# Patient Record
Sex: Male | Born: 1975 | Race: White | Hispanic: No | Marital: Single | Smoking: Current every day smoker
Health system: Southern US, Community
[De-identification: ages and names within clinical notes are randomized; demographics above are authoritative.]

## PROBLEM LIST (undated history)

## (undated) DIAGNOSIS — M6282 Rhabdomyolysis: Secondary | ICD-10-CM

## (undated) DIAGNOSIS — N179 Acute kidney failure, unspecified: Secondary | ICD-10-CM

## (undated) DIAGNOSIS — F988 Other specified behavioral and emotional disorders with onset usually occurring in childhood and adolescence: Secondary | ICD-10-CM

## (undated) HISTORY — PX: KNEE SURGERY: SHX244

---

## 2018-05-22 ENCOUNTER — Other Ambulatory Visit: Payer: Self-pay

## 2018-05-22 ENCOUNTER — Inpatient Hospital Stay (HOSPITAL_COMMUNITY)
Admission: EM | Admit: 2018-05-22 | Discharge: 2018-05-28 | DRG: 683 | Disposition: A | Discharge status: Home / Self Care | Payer: Self-pay | Attending: Internal Medicine | Admitting: Internal Medicine

## 2018-05-22 DIAGNOSIS — R945 Abnormal results of liver function studies: Secondary | ICD-10-CM

## 2018-05-22 DIAGNOSIS — R7303 Prediabetes: Secondary | ICD-10-CM | POA: Diagnosis present

## 2018-05-22 DIAGNOSIS — F17200 Nicotine dependence, unspecified, uncomplicated: Secondary | ICD-10-CM | POA: Diagnosis present

## 2018-05-22 DIAGNOSIS — M549 Dorsalgia, unspecified: Secondary | ICD-10-CM | POA: Diagnosis present

## 2018-05-22 DIAGNOSIS — N179 Acute kidney failure, unspecified: Principal | ICD-10-CM | POA: Diagnosis present

## 2018-05-22 DIAGNOSIS — I10 Essential (primary) hypertension: Secondary | ICD-10-CM | POA: Diagnosis present

## 2018-05-22 DIAGNOSIS — G8929 Other chronic pain: Secondary | ICD-10-CM | POA: Diagnosis present

## 2018-05-22 DIAGNOSIS — B1920 Unspecified viral hepatitis C without hepatic coma: Secondary | ICD-10-CM | POA: Diagnosis present

## 2018-05-22 DIAGNOSIS — R339 Retention of urine, unspecified: Secondary | ICD-10-CM | POA: Diagnosis present

## 2018-05-22 DIAGNOSIS — Z79899 Other long term (current) drug therapy: Secondary | ICD-10-CM

## 2018-05-22 DIAGNOSIS — E860 Dehydration: Secondary | ICD-10-CM | POA: Diagnosis present

## 2018-05-22 DIAGNOSIS — M6282 Rhabdomyolysis: Secondary | ICD-10-CM

## 2018-05-22 DIAGNOSIS — D72829 Elevated white blood cell count, unspecified: Secondary | ICD-10-CM | POA: Diagnosis present

## 2018-05-22 DIAGNOSIS — Z79891 Long term (current) use of opiate analgesic: Secondary | ICD-10-CM

## 2018-05-22 DIAGNOSIS — R7989 Other specified abnormal findings of blood chemistry: Secondary | ICD-10-CM

## 2018-05-22 DIAGNOSIS — K760 Fatty (change of) liver, not elsewhere classified: Secondary | ICD-10-CM | POA: Diagnosis present

## 2018-05-22 HISTORY — DX: Other specified behavioral and emotional disorders with onset usually occurring in childhood and adolescence: F98.8

## 2018-05-22 HISTORY — DX: Rhabdomyolysis: M62.82

## 2018-05-22 HISTORY — DX: Acute kidney failure, unspecified: N17.9

## 2018-05-22 LAB — CBC WITH DIFFERENTIAL/PLATELET
Abs Immature Granulocytes: 0.1 10*3/uL (ref 0.0–0.1)
Basophils Absolute: 0.1 10*3/uL (ref 0.0–0.1)
Basophils Relative: 0 %
EOS ABS: 0 10*3/uL (ref 0.0–0.7)
Eosinophils Relative: 0 %
HEMATOCRIT: 46.8 % (ref 39.0–52.0)
Hemoglobin: 15.7 g/dL (ref 13.0–17.0)
IMMATURE GRANULOCYTES: 1 %
LYMPHS ABS: 3.6 10*3/uL (ref 0.7–4.0)
Lymphocytes Relative: 23 %
MCH: 29 pg (ref 26.0–34.0)
MCHC: 33.5 g/dL (ref 30.0–36.0)
MCV: 86.3 fL (ref 78.0–100.0)
MONOS PCT: 7 %
Monocytes Absolute: 1.1 10*3/uL — ABNORMAL HIGH (ref 0.1–1.0)
NEUTROS ABS: 10.5 10*3/uL — AB (ref 1.7–7.7)
NEUTROS PCT: 69 %
Platelets: 241 10*3/uL (ref 150–400)
RBC: 5.42 MIL/uL (ref 4.22–5.81)
RDW: 12.6 % (ref 11.5–15.5)
WBC: 15.3 10*3/uL — ABNORMAL HIGH (ref 4.0–10.5)

## 2018-05-22 MED ORDER — ONDANSETRON HCL 4 MG/2ML IJ SOLN
4.0000 mg | Freq: Once | INTRAMUSCULAR | Status: AC
  Administered 2018-05-22: 4 mg via INTRAVENOUS
  Filled 2018-05-22: qty 2

## 2018-05-22 MED ORDER — SODIUM CHLORIDE 0.9 % IV BOLUS
1000.0000 mL | Freq: Once | INTRAVENOUS | Status: AC
  Administered 2018-05-22: 1000 mL via INTRAVENOUS

## 2018-05-22 MED ORDER — MORPHINE SULFATE (PF) 4 MG/ML IV SOLN
4.0000 mg | Freq: Once | INTRAVENOUS | Status: AC
  Administered 2018-05-22: 4 mg via INTRAVENOUS
  Filled 2018-05-22: qty 1

## 2018-05-22 NOTE — ED Triage Notes (Signed)
PT arrives via [facility] from [facility], states hasn't been able to void since last night but feels like bladder is full, with painful urniation at last attempt this morning.  Pt has hx of the same which led to prior hospitalization.  Hx HTN, 155/114 BP, sinus tach 113, RR 16, 97% on RA CBG 113 C/o pain in lower and upper back, lower abdomen

## 2018-05-22 NOTE — ED Notes (Signed)
Informed pt of need for urine, encouraged to attempt to urineate

## 2018-05-22 NOTE — ED Provider Notes (Signed)
[facility] EMERGENCY DEPARTMENT Provider Note   CSN: [id number] Arrival date & time: 05/22/18  2022     History   Chief Complaint Chief Complaint  Patient presents with  . Urinary Retention  . Back Pain    HPI Isaac Snyder is a 42 y.o. male.  42 year old male brought in by EMS for complaint of inability to urinate, suprapubic pain and distention, back pain.  Also notes feeling nauseous, sweaty, diffuse body aches.  She states his symptoms started today around noon, states this happened last year and he was [facility] his "kidneys shut down."  Patient states he works outside at [employer], has been sweating a lot for the past few days.  He denies vomiting, fevers, chills, changes in bowel habits.  Patient has had 2 large Mountain Dew drinks as well as 2 large cups of water today, last urinary output was this morning.  Denies any other complaints or concerns.     No past medical history on file.  There are no active problems to display for this patient.    Home Medications    Prior to Admission medications   Medication Sig Start Date End Date Taking? Authorizing Provider  amLODipine (NORVASC) 10 MG tablet Take 10 mg by mouth daily.   Yes [provider]  amphetamine-dextroamphetamine (ADDERALL) 20 MG tablet Take 20 mg by mouth 2 (two) times daily.   Yes [provider]  cloNIDine (CATAPRES) 0.2 MG tablet Take 0.2 mg by mouth 2 (two) times daily.   Yes [provider]  gabapentin (NEURONTIN) 600 MG tablet Take 600 mg by mouth 3 (three) times daily.   Yes [provider]  oxyCODONE (OXY IR/ROXICODONE) 5 MG immediate release tablet Take 5 mg by mouth 4 (four) times daily as needed for severe pain.   Yes [provider]    Family History No family history on file.  Social History Social History   Tobacco Use  . Smoking status: Not on file  Substance Use Topics  . Alcohol use: Not on file  .  Drug use: Not on file     Allergies   Patient has no known allergies.   Review of Systems Review of Systems  Constitutional: Positive for diaphoresis. Negative for chills and fever.  Respiratory: Negative for shortness of breath.   Cardiovascular: Negative for chest pain.  Gastrointestinal: Positive for abdominal distention, abdominal pain and nausea. Negative for constipation, diarrhea and vomiting.  Genitourinary: Positive for difficulty urinating and urgency. Negative for dysuria and frequency.  Musculoskeletal: Positive for back pain and myalgias. Negative for joint swelling.  Skin: Negative for rash and wound.  Allergic/Immunologic: Negative for immunocompromised state.  Neurological: Negative for dizziness and weakness.  Hematological: Does not bruise/bleed easily.  Psychiatric/Behavioral: Negative for confusion.  All other systems reviewed and are negative.    Physical Exam Updated Vital Signs BP (!) 152/128   Pulse (!) 109   Temp 99.7 F (37.6 C) (Oral)   Resp 17   Ht 6' (1.829 m)   Wt 81.6 kg (180 lb)   SpO2 99%   BMI 24.41 kg/m   Physical Exam  Constitutional: He is oriented to person, place, and time. He appears well-developed and well-nourished. No distress.  HENT:  Head: Normocephalic and atraumatic.  Eyes: Conjunctivae are normal.  Neck: Neck supple.  Cardiovascular: Normal heart sounds and intact distal pulses. Tachycardia present.  Pulmonary/Chest: Effort normal and breath sounds normal. No respiratory distress.  Abdominal: Soft. Bowel sounds are normal. He exhibits no distension. There is tenderness in the suprapubic area. There is no guarding and no CVA tenderness.  Musculoskeletal: He exhibits no tenderness.       Thoracic back: Normal.       Lumbar back: Normal.  Neurological: He is alert and oriented to person, place, and time.  Skin: Skin is warm and dry. Capillary refill takes less than 2 seconds. No rash noted. He is not diaphoretic.    Psychiatric: He has a normal mood and affect. His behavior is normal.  Nursing note and vitals reviewed.    ED Treatments / Results  Labs (all labs ordered are listed, but only abnormal results are displayed) Labs Reviewed  BASIC METABOLIC PANEL  CBC WITH DIFFERENTIAL/PLATELET  URINALYSIS, ROUTINE W REFLEX MICROSCOPIC  CK    EKG None  Radiology No results found.  Procedures Procedures (including critical care time)  Medications Ordered in ED Medications  sodium chloride 0.9 % bolus 1,000 mL (1,000 mLs Intravenous New Bag/Given 05/22/18 2151)  morphine 4 MG/ML injection 4 mg (4 mg Intravenous Given 05/22/18 2145)  ondansetron (ZOFRAN) injection 4 mg (4 mg Intravenous Given 05/22/18 2145)     Initial Impression / Assessment and Plan / ED Course  I have reviewed the triage vital signs and the nursing notes.  Pertinent labs & imaging results that were available during my care of the patient were reviewed by me and considered in my medical decision making (see chart for details).  Clinical Course as of May 22 2218  Fri May 22, 2018  2216 Change in shift, care signed out to [place], [place], pending labs and fluids.   [LM]    Clinical Course User Index [LM] Jeannie Fend, PA-C    Final Clinical Impressions(s) / ED Diagnoses   Final diagnoses:  None    ED Discharge Orders    None       Alden Hipp 05/22/18 2219    Terrilee Files, MD 05/23/18 [phone]

## 2018-05-22 NOTE — ED Provider Notes (Signed)
?  urinary distention Happened before, h/o "kidneys shutting down" Dehydration vs rhabdo 100 cc on bladder scan GPD on discharge  Recheck blood pressure Probably a 2nd liter UA, check labs  Delay in lab results due to hemolysis of specimen. Recollected and are still pending at time of re-evaluation.   The patient continues to complain of back pain. Will provide additional pain relief. He is attempting to obtain urine specimen. Will add additional liter IVF's.   Labs resulted and are significant for AKI with Cr 2.97 and total CK of 3265 indicating rhabdomyolysis, both likely due to severe dehydration. Will call for admission for further hydration and treatment.    Elpidio AnisUpstill, Trusten Hume, PA-C 05/23/18 0053    Terrilee FilesButler, Michael C, MD 05/23/18 [phone]

## 2018-05-23 ENCOUNTER — Inpatient Hospital Stay (HOSPITAL_COMMUNITY): Payer: Self-pay

## 2018-05-23 ENCOUNTER — Encounter (HOSPITAL_COMMUNITY): Payer: Self-pay | Admitting: Internal Medicine

## 2018-05-23 DIAGNOSIS — M6282 Rhabdomyolysis: Secondary | ICD-10-CM

## 2018-05-23 DIAGNOSIS — N179 Acute kidney failure, unspecified: Secondary | ICD-10-CM | POA: Diagnosis present

## 2018-05-23 LAB — BASIC METABOLIC PANEL
ANION GAP: 11 (ref 5–15)
BUN: 23 mg/dL — ABNORMAL HIGH (ref 6–20)
CALCIUM: 8.9 mg/dL (ref 8.9–10.3)
CHLORIDE: 101 mmol/L (ref 98–111)
CO2: 23 mmol/L (ref 22–32)
CREATININE: 2.97 mg/dL — AB (ref 0.61–1.24)
GFR calc Af Amer: 28 mL/min — ABNORMAL LOW (ref >60)
GFR calc non Af Amer: 24 mL/min — ABNORMAL LOW (ref >60)
Glucose, Bld: 103 mg/dL — ABNORMAL HIGH (ref 70–99)
Potassium: 3.5 mmol/L (ref 3.5–5.1)
SODIUM: 135 mmol/L (ref 135–145)

## 2018-05-23 LAB — CBC
HCT: 43.1 % (ref 39.0–52.0)
Hemoglobin: 14.5 g/dL (ref 13.0–17.0)
MCH: 28.9 pg (ref 26.0–34.0)
MCHC: 33.6 g/dL (ref 30.0–36.0)
MCV: 85.9 fL (ref 78.0–100.0)
PLATELETS: 244 10*3/uL (ref 150–400)
RBC: 5.02 MIL/uL (ref 4.22–5.81)
RDW: 12.2 % (ref 11.5–15.5)
WBC: 11.8 10*3/uL — AB (ref 4.0–10.5)

## 2018-05-23 LAB — RAPID URINE DRUG SCREEN, HOSP PERFORMED
Amphetamines: POSITIVE — AB
Benzodiazepines: NOT DETECTED
Cocaine: NOT DETECTED
Opiates: POSITIVE — AB
Tetrahydrocannabinol: NOT DETECTED

## 2018-05-23 LAB — COMPREHENSIVE METABOLIC PANEL
ALT: 21 U/L (ref 0–44)
AST: 54 U/L — AB (ref 15–41)
Albumin: 3.7 g/dL (ref 3.5–5.0)
Alkaline Phosphatase: 65 U/L (ref 38–126)
Anion gap: 9 (ref 5–15)
BILIRUBIN TOTAL: 0.9 mg/dL (ref 0.3–1.2)
BUN: 22 mg/dL — AB (ref 6–20)
CO2: 25 mmol/L (ref 22–32)
CREATININE: 2.69 mg/dL — AB (ref 0.61–1.24)
Calcium: 8.8 mg/dL — ABNORMAL LOW (ref 8.9–10.3)
Chloride: 102 mmol/L (ref 98–111)
GFR calc Af Amer: 32 mL/min — ABNORMAL LOW (ref >60)
GFR, EST NON AFRICAN AMERICAN: 27 mL/min — AB (ref >60)
Glucose, Bld: 181 mg/dL — ABNORMAL HIGH (ref 70–99)
Potassium: 3.6 mmol/L (ref 3.5–5.1)
Sodium: 136 mmol/L (ref 135–145)
TOTAL PROTEIN: 6.9 g/dL (ref 6.5–8.1)

## 2018-05-23 LAB — URINALYSIS, ROUTINE W REFLEX MICROSCOPIC
Bilirubin Urine: NEGATIVE
Glucose, UA: NEGATIVE mg/dL
KETONES UR: 5 mg/dL — AB
Leukocytes, UA: NEGATIVE
Nitrite: NEGATIVE
PH: 5 (ref 5.0–8.0)
Protein, ur: NEGATIVE mg/dL
SPECIFIC GRAVITY, URINE: 1.011 (ref 1.005–1.030)

## 2018-05-23 LAB — SEDIMENTATION RATE: SED RATE: 34 mm/h — AB (ref 0–16)

## 2018-05-23 LAB — TSH: TSH: 1.117 u[IU]/mL (ref 0.350–4.500)

## 2018-05-23 LAB — HIV ANTIBODY (ROUTINE TESTING W REFLEX): HIV Screen 4th Generation wRfx: NONREACTIVE

## 2018-05-23 LAB — MRSA PCR SCREENING: MRSA BY PCR: NEGATIVE

## 2018-05-23 LAB — CK TOTAL AND CKMB (NOT AT ARMC)
CK, MB: 15.2 ng/mL — AB (ref 0.5–5.0)
Relative Index: 0.4 (ref 0.0–2.5)
Total CK: 3872 U/L — ABNORMAL HIGH (ref 49–397)

## 2018-05-23 LAB — CK: Total CK: 3265 U/L — ABNORMAL HIGH (ref 49–397)

## 2018-05-23 LAB — SODIUM, URINE, RANDOM: Sodium, Ur: 34 mmol/L

## 2018-05-23 MED ORDER — FENTANYL CITRATE (PF) 100 MCG/2ML IJ SOLN
12.5000 ug | INTRAMUSCULAR | Status: DC | PRN
  Administered 2018-05-23 – 2018-05-28 (×16): 12.5 ug via INTRAVENOUS
  Filled 2018-05-23 (×16): qty 2

## 2018-05-23 MED ORDER — KETOROLAC TROMETHAMINE 15 MG/ML IJ SOLN
15.0000 mg | Freq: Once | INTRAMUSCULAR | Status: AC
  Administered 2018-05-23: 15 mg via INTRAVENOUS
  Filled 2018-05-23: qty 1

## 2018-05-23 MED ORDER — AMLODIPINE BESYLATE 10 MG PO TABS
10.0000 mg | ORAL_TABLET | Freq: Every day | ORAL | Status: DC
  Administered 2018-05-23 – 2018-05-28 (×6): 10 mg via ORAL
  Filled 2018-05-23 (×6): qty 1

## 2018-05-23 MED ORDER — MORPHINE SULFATE (PF) 4 MG/ML IV SOLN
4.0000 mg | Freq: Once | INTRAVENOUS | Status: DC
  Filled 2018-05-23: qty 1

## 2018-05-23 MED ORDER — AMPHETAMINE-DEXTROAMPHETAMINE 10 MG PO TABS
20.0000 mg | ORAL_TABLET | Freq: Once | ORAL | Status: AC
  Administered 2018-05-23: 20 mg via ORAL
  Filled 2018-05-23: qty 2

## 2018-05-23 MED ORDER — HEPARIN SODIUM (PORCINE) 5000 UNIT/ML IJ SOLN
5000.0000 [IU] | Freq: Three times a day (TID) | INTRAMUSCULAR | Status: DC
  Administered 2018-05-23 – 2018-05-27 (×14): 5000 [IU] via SUBCUTANEOUS
  Filled 2018-05-23 (×12): qty 1

## 2018-05-23 MED ORDER — ONDANSETRON HCL 4 MG/2ML IJ SOLN: 4.0000 mg | Freq: Four times a day (QID) | INTRAMUSCULAR | Status: DC | PRN

## 2018-05-23 MED ORDER — OXYCODONE HCL 5 MG PO TABS
5.0000 mg | ORAL_TABLET | Freq: Four times a day (QID) | ORAL | Status: DC | PRN
  Administered 2018-05-23 – 2018-05-28 (×17): 5 mg via ORAL
  Filled 2018-05-23 (×17): qty 1

## 2018-05-23 MED ORDER — CLONIDINE HCL 0.2 MG PO TABS
0.2000 mg | ORAL_TABLET | Freq: Two times a day (BID) | ORAL | Status: DC
  Administered 2018-05-23 – 2018-05-28 (×12): 0.2 mg via ORAL
  Filled 2018-05-23 (×13): qty 1

## 2018-05-23 MED ORDER — SODIUM CHLORIDE 0.9 % IV BOLUS
1000.0000 mL | Freq: Once | INTRAVENOUS | Status: AC
  Administered 2018-05-23: 1000 mL via INTRAVENOUS

## 2018-05-23 MED ORDER — SODIUM CHLORIDE 0.9 % IV SOLN
INTRAVENOUS | Status: AC
  Administered 2018-05-23 – 2018-05-24 (×7): via INTRAVENOUS

## 2018-05-23 MED ORDER — ACETAMINOPHEN 325 MG PO TABS
650.0000 mg | ORAL_TABLET | Freq: Four times a day (QID) | ORAL | Status: DC | PRN
  Administered 2018-05-23 – 2018-05-25 (×2): 650 mg via ORAL
  Filled 2018-05-23 (×2): qty 2

## 2018-05-23 MED ORDER — FENTANYL CITRATE (PF) 100 MCG/2ML IJ SOLN
100.0000 ug | Freq: Once | INTRAMUSCULAR | Status: AC
  Administered 2018-05-23: 100 ug via INTRAVENOUS
  Filled 2018-05-23: qty 2

## 2018-05-23 MED ORDER — ACETAMINOPHEN 650 MG RE SUPP: 650.0000 mg | Freq: Four times a day (QID) | RECTAL | Status: DC | PRN

## 2018-05-23 NOTE — Progress Notes (Signed)
  New Admission Note:  Arrival Method: Wheelchair Mental Orientation: Alert and oriented x 4 Telemetry: Box 17 Assessment: Completed Skin: Warm and dry  IV: NSL Pain: Denies  Tubes: N/A Safety Measures: Safety Fall Prevention Plan initiated.  Admission: Completed 5 M  Orientation: Patient has been orientated to the room, unit and the staff. Family: None   Orders have been reviewed and implemented. Will continue to monitor the patient. Call light has been placed within reach and bed alarm has been activated.   [place] ShiEmmanuel Clay Menser BSN, RN  Phone Number: [phone]

## 2018-05-23 NOTE — H&P (Signed)
[facility] H&P   Patient Demographics:    Isaac Snyder, is a 42 y.o. male  MRN: [id number]   DOB - 12/08/1975  Admit Date - 05/22/2018  Outpatient Primary MD for the patient is No primary care provider on file.  Referring MD/NP/PA: Elpidio Anis  Outpatient Specialists:     Patient coming from: home  Chief Complaint  Patient presents with  . Urinary Retention  . Back Pain      HPI:    Isaac Snyder  is a 41 y.o. male, w hx of ARF and rhabdomyolysis 2018 apparently [place] and [place] and not feeling well .  Had 2 episodes of n/v.  Denies fever, chills, cough, cp, palp, sob, diarrhea, brbpr , black stool.  Pt denies NSAID or statin use.  No recent change in medications.  Denies coccaine, ecstacy use. Pt felt myalgia similar to last year when had ARF and rhabdomyolysis and admitted to [facility] for similar circumstances. No family hx of renal failure or Rhabdo.   In Ed,  Wbc 15.3 Hgb 15.7, Plt 241 Na 135, K 3.5, Bun 23, Creatinine 2.97 CK 3,265  Pt will be admitted for ARF, and rhabdomyolysis.        Review of systems:    In addition to the HPI above, No Fever-chills, No Headache, No changes with Vision or hearing, No problems swallowing food or Liquids, No Chest pain, Cough or Shortness of Breath, No Abdominal pain, No Nausea or Vommitting, Bowel movements are regular, No Blood in stool or Urine, No dysuria, No new skin rashes or bruises,  No new weakness, tingling, numbness in any extremity, No recent weight gain or loss, No polyuria, polydypsia or polyphagia, No significant Mental Stressors.  A full 10 point Review of Systems was done, except as stated above, all other Review of Systems were negative.   With Past History of the following :    Past Medical History:  Diagnosis Date  . ADD (attention deficit disorder)   . ARF (acute  renal failure) (HCC)   . Rhabdomyolysis       Past Surgical History:  Procedure Laterality Date  . KNEE SURGERY Left       Social History:     Social History   Tobacco Use  . Smoking status: Current Every Day Smoker    Types: Cigarettes  . Smokeless tobacco: Never Used  Substance Use Topics  . Alcohol use: Not Currently     Lives - at home  Mobility - walks by self     Family History :     Family History  Problem Relation Age of Onset  . COPD Father        Home Medications:   Prior to Admission medications   Medication Sig Start Date End Date Taking? Authorizing Provider  amLODipine (NORVASC) 10 MG tablet Take 10 mg by mouth  daily.   Yes [provider]  amphetamine-dextroamphetamine (ADDERALL) 20 MG tablet Take 20 mg by mouth 2 (two) times daily.   Yes [provider]  cloNIDine (CATAPRES) 0.2 MG tablet Take 0.2 mg by mouth 2 (two) times daily.   Yes [provider]  gabapentin (NEURONTIN) 600 MG tablet Take 600 mg by mouth 3 (three) times daily.   Yes [provider]  oxyCODONE (OXY IR/ROXICODONE) 5 MG immediate release tablet Take 5 mg by mouth 4 (four) times daily as needed for severe pain.   Yes [provider]     Allergies:    No Known Allergies   Physical Exam:   Vitals  Blood pressure (!) 149/93, pulse 93, temperature 99.7 F (37.6 C), temperature source Oral, resp. rate 19, height 6' (1.829 m), weight 81.6 kg (180 lb), SpO2 98 %.   1. General  lying in bed in NAD,    2. Normal affect and insight, Not Suicidal or Homicidal, Awake Alert, Oriented X 3.  3. No F.N deficits, ALL C.Nerves Intact, Strength 5/5 all 4 extremities, Sensation intact all 4 extremities, Plantars down going.  4. Ears and Eyes appear Normal, Conjunctivae clear, PERRLA. Moist Oral Mucosa.  5. Supple Neck, No JVD, No cervical lymphadenopathy appriciated, No Carotid Bruits.  6. Symmetrical Chest wall movement, Good air movement  bilaterally, CTAB.  7. RRR, No Gallops, Rubs or Murmurs, No Parasternal Heave.  8. Positive Bowel Sounds, Abdomen Soft, No tenderness, No organomegaly appriciated,No rebound -guarding or rigidity.  9.  No Cyanosis, Normal Skin Turgor, No Skin Rash or Bruise.  10. Good muscle tone,  joints appear normal , no effusions, Normal ROM.  11. No Palpable Lymph Nodes in Neck or Axillae      Data Review:    CBC Recent Labs  Lab 05/22/18 2132  WBC 15.3*  HGB 15.7  HCT 46.8  PLT 241  MCV 86.3  MCH 29.0  MCHC 33.5  RDW 12.6  LYMPHSABS 3.6  MONOABS 1.1*  EOSABS 0.0  BASOSABS 0.1   ------------------------------------------------------------------------------------------------------------------  Chemistries  Recent Labs  Lab 05/22/18 2330  NA 135  K 3.5  CL 101  CO2 23  GLUCOSE 103*  BUN 23*  CREATININE 2.97*  CALCIUM 8.9   ------------------------------------------------------------------------------------------------------------------ estimated creatinine clearance is 35.2 mL/min (A) (by C-G formula based on SCr of 2.97 mg/dL (H)). ------------------------------------------------------------------------------------------------------------------ No results for input(s): TSH, T4TOTAL, T3FREE, THYROIDAB in the last 72 hours.  Invalid input(s): FREET3  Coagulation profile No results for input(s): INR, PROTIME in the last 168 hours. ------------------------------------------------------------------------------------------------------------------- No results for input(s): DDIMER in the last 72 hours. -------------------------------------------------------------------------------------------------------------------  Cardiac Enzymes No results for input(s): CKMB, TROPONINI, MYOGLOBIN in the last 168 hours.  Invalid input(s): CK ------------------------------------------------------------------------------------------------------------------ No results found for:  BNP   ---------------------------------------------------------------------------------------------------------------  Urinalysis No results found for: COLORURINE, APPEARANCEUR, LABSPEC, PHURINE, GLUCOSEU, HGBUR, BILIRUBINUR, KETONESUR, PROTEINUR, UROBILINOGEN, NITRITE, LEUKOCYTESUR  ----------------------------------------------------------------------------------------------------------------   Imaging Results:    No results found.     Assessment & Plan:    Principal Problem:   ARF (acute renal failure) (HCC) Active Problems:   Rhabdomyolysis    ARF Check renal ultrasound Check urinalysis, urine sodium, urine creatinine, urine eosinophils Ns at /hr iv  Rhabdomyolysis STOP ADDERALL , GABAPENTIN (case reports of adderall and rhabdo) Ns at /hr iv Check CK, MB in am  Hypertension Cont clonidine Cont amlodipine  Chronic pain Cont oxycodone Dilaudid and fentanyl are better cleared, if renal function not improving consider switching  DVT Prophylaxis Heparin  - SCDs    AM Labs Ordered, also please review Full Orders  Family Communication: Admission, patients condition and plan of care including tests being ordered have been discussed with the patient who indicate understanding and agree with the plan and Code Status.  Code Status  FULL CODE  Likely DC to  home  Condition GUARDED     Consults called:  none  Admission status: inpatient  Time spent in minutes : 60   Pearson GrippeJames Jourdon Zimmerle M.D on 05/23/2018 at 1:57 AM  Between 7am to 7pm - Pager - [phone]  After 7pm go to [email or web address] - password [email or web address]  [facility] - Office  [phone]

## 2018-05-23 NOTE — Progress Notes (Signed)
The patient was admitted early this morning after midnight and H&P has been reviewed and I am in current agreement with assessment and plan done by Dr. Pearson GrippeJames Kim.  Additional changes to the plan of care been made accordingly.  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 room with a chief complaint of not feeling well and being dehydrated.  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  Patient states that h he also had some back pain and muscle aches and felt similar to last years episode.  Initial work-up in the emergency room found the patient had acute renal failure and elevated CK he was admitted for acute renal failure with rhabdomyolysis.  On normal saline at a rate of 200 mL/hr however we will drop that down to 125 mL's per hour.  Patient's Adderall was stopped however we will give him one-time dose this morning and continue to monitor as a been case reports of Adderall and rhabdomyolysis.  Since renal function is slightly improved from yesterday however his CK still remains quite elevated.  White blood cell count is trending down.  We will check a renal ultrasound because of his acute kidney injury as well as obtain urine electrolytes.  If patient continues to complain of back pain will obtain imaging and specifically an x-ray initially. Did receive IV ketorolac in the emergency room and we will stop this given his kidney function.  We will continue to monitor patient's clinical response to intervention and repeat blood work in the morning.

## 2018-05-24 ENCOUNTER — Inpatient Hospital Stay (HOSPITAL_COMMUNITY): Payer: Self-pay

## 2018-05-24 DIAGNOSIS — D72829 Elevated white blood cell count, unspecified: Secondary | ICD-10-CM

## 2018-05-24 DIAGNOSIS — M545 Low back pain: Secondary | ICD-10-CM

## 2018-05-24 DIAGNOSIS — I10 Essential (primary) hypertension: Secondary | ICD-10-CM

## 2018-05-24 LAB — COMPREHENSIVE METABOLIC PANEL
ALK PHOS: 51 U/L (ref 38–126)
ALT: 30 U/L (ref 0–44)
AST: 81 U/L — AB (ref 15–41)
Albumin: 2.9 g/dL — ABNORMAL LOW (ref 3.5–5.0)
Anion gap: 4 — ABNORMAL LOW (ref 5–15)
BILIRUBIN TOTAL: 0.4 mg/dL (ref 0.3–1.2)
BUN: 13 mg/dL (ref 6–20)
CALCIUM: 8.4 mg/dL — AB (ref 8.9–10.3)
CHLORIDE: 112 mmol/L — AB (ref 98–111)
CO2: 26 mmol/L (ref 22–32)
Creatinine, Ser: 1.44 mg/dL — ABNORMAL HIGH (ref 0.61–1.24)
GFR calc Af Amer: >60 mL/min (ref >60)
GFR calc non Af Amer: 58 mL/min — ABNORMAL LOW (ref >60)
GLUCOSE: 106 mg/dL — AB (ref 70–99)
Potassium: 4.1 mmol/L (ref 3.5–5.1)
Sodium: 142 mmol/L (ref 135–145)
TOTAL PROTEIN: 5.7 g/dL — AB (ref 6.5–8.1)

## 2018-05-24 LAB — HEMOGLOBIN A1C
HEMOGLOBIN A1C: 6.3 % — AB (ref 4.8–5.6)
Mean Plasma Glucose: 134.11 mg/dL

## 2018-05-24 LAB — CBC WITH DIFFERENTIAL/PLATELET
ABS IMMATURE GRANULOCYTES: 0 10*3/uL (ref 0.0–0.1)
Basophils Absolute: 0 10*3/uL (ref 0.0–0.1)
Basophils Relative: 1 %
Eosinophils Absolute: 0.1 10*3/uL (ref 0.0–0.7)
Eosinophils Relative: 2 %
HEMATOCRIT: 40.3 % (ref 39.0–52.0)
Hemoglobin: 13.2 g/dL (ref 13.0–17.0)
IMMATURE GRANULOCYTES: 0 %
LYMPHS ABS: 2.1 10*3/uL (ref 0.7–4.0)
Lymphocytes Relative: 40 %
MCH: 28.5 pg (ref 26.0–34.0)
MCHC: 32.8 g/dL (ref 30.0–36.0)
MCV: 87 fL (ref 78.0–100.0)
MONO ABS: 0.4 10*3/uL (ref 0.1–1.0)
Monocytes Relative: 8 %
NEUTROS ABS: 2.6 10*3/uL (ref 1.7–7.7)
NEUTROS PCT: 49 %
Platelets: 183 10*3/uL (ref 150–400)
RBC: 4.63 MIL/uL (ref 4.22–5.81)
RDW: 12 % (ref 11.5–15.5)
WBC: 5.3 10*3/uL (ref 4.0–10.5)

## 2018-05-24 LAB — CALCIUM / CREATININE RATIO, URINE
CALCIUM UR: 2.7 mg/dL
CALCIUM/CREAT. RATIO: 21 mg/g{creat} (ref 0–260)
Creatinine, Urine: 127.1 mg/dL

## 2018-05-24 LAB — MAGNESIUM: MAGNESIUM: 1.8 mg/dL (ref 1.7–2.4)

## 2018-05-24 LAB — GLUCOSE, CAPILLARY
Glucose-Capillary: 117 mg/dL — ABNORMAL HIGH (ref 70–99)
Glucose-Capillary: 127 mg/dL — ABNORMAL HIGH (ref 70–99)

## 2018-05-24 LAB — PHOSPHORUS: Phosphorus: 2.9 mg/dL (ref 2.5–4.6)

## 2018-05-24 LAB — CK: Total CK: 3981 U/L — ABNORMAL HIGH (ref 49–397)

## 2018-05-24 MED ORDER — HYDRALAZINE HCL 20 MG/ML IJ SOLN: 10.0000 mg | Freq: Four times a day (QID) | INTRAMUSCULAR | Status: DC | PRN

## 2018-05-24 MED ORDER — AMPHETAMINE-DEXTROAMPHETAMINE 10 MG PO TABS
20.0000 mg | ORAL_TABLET | Freq: Once | ORAL | Status: AC
  Administered 2018-05-24: 20 mg via ORAL
  Filled 2018-05-24: qty 2

## 2018-05-24 MED ORDER — NICOTINE 21 MG/24HR TD PT24
21.0000 mg | MEDICATED_PATCH | Freq: Every day | TRANSDERMAL | Status: DC
  Administered 2018-05-24 – 2018-05-27 (×4): 21 mg via TRANSDERMAL
  Filled 2018-05-24 (×5): qty 1

## 2018-05-24 MED ORDER — TRAZODONE HCL 50 MG PO TABS
50.0000 mg | ORAL_TABLET | Freq: Once | ORAL | Status: AC
  Administered 2018-05-24: 50 mg via ORAL
  Filled 2018-05-24: qty 1

## 2018-05-24 MED ORDER — INSULIN ASPART 100 UNIT/ML ~~LOC~~ SOLN
0.0000 [IU] | Freq: Three times a day (TID) | SUBCUTANEOUS | Status: DC
  Administered 2018-05-25 – 2018-05-27 (×2): 2 [IU] via SUBCUTANEOUS

## 2018-05-24 NOTE — Progress Notes (Signed)
PROGRESS NOTE    Sye Schroepfer  SHF:[id number] DOB: 09-09-1976 DOA: 05/22/2018 PCP: No primary care provider on file.   Brief Narrative:  The patient is a 42 year old Caucasian male with a past medical history significant for attention deficit disorder, acute renal failure, with prior rhabdomyolysis, and history of reported MI along with other comorbidities who presented to [facility] emergency room with a chief complaint of not feeling well and being dehydrated.    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  Patient states that he also had some back pain and muscle aches and felt similar to last years episode.  Initial work-up in the emergency room found the patient had acute renal failure and elevated CK he was admitted for acute renal failure with rhabdomyolysis.  He was given normal saline at a rate of 200 mL/hr however it was reduced to 125 mL's per hour.    Patient's Adderall (takes 20 mg po BID) was stopped however we will give him one-time dose again this morning and continue to monitor as a been case reports of Adderall and rhabdomyolysis. His renal function is improving from yesterday however his CK still remains quite elevated and higher than when he came in.  White blood cell count is trending down. Checked a renal ultrasound because of his acute kidney injury as well as obtain urine electrolytes. Patient still complaining of back pain specifically on left side though. Renal U/S showed Focal lobulated contour within the outer cortex of the LEFT kidney, of uncertain significance, isoechoic renal mass not excluded. No acute findings noted and no hydronephrosis.  Because of this focal lobulated contour with an outward of the left kidney we will obtain a CT renal protocol to  evaluate further.  Assessment & Plan:   Principal Problem:   ARF (acute renal failure) (HCC) Active Problems:   Rhabdomyolysis  AKI  -BUN/Cr went from 23/2.97 on Admission and is now improved to 13/1.44 -Checked renal ultrasound and showed Focal lobulated contour within the outer cortex of the LEFT kidney, of uncertain significance, isoechoic renal mass not excluded. No acute findings noted and no hydronephrosis.  -Checked Urinalysis and showed the appearance, moderate hemoglobin, 5 ketones, negative leukocytes, negative nitrites, rare bacteria 0-5 RBCs and WBCs -Urine Sodium was 34, Urine Creatinine was 127.1, Urine Eosinophils -C/w NS at a rate of 125 mL/hr -Continue to Monitor and repeat CMP in AM   Rhabdomyolysis -Stopped ADDERALL on admission but given 1x dose again this AM, GABAPENTIN (case reports of adderall and rhabdo) -C/w NS at a rate of 125 mL/hr -ESR was 34 -CK is worsening and went from 3265 on admission is now 3981 -Repeat CK in AM   Hypertension -C/w Clonidine 0.2 po BID -C/w Amlodipine 10 mg po Daily  -Will add IV Hydralazine 10 mg q6hprn for SBP>180 or DBP>100  Chronic Pain; Acute Left Flank Pain -C/w Oxycodone IR 5 mg po 4 times daily pRN -Added Fentanyl 12.5 mcg IV q2hprn Severe Pain not relieved by Oxycodone IR -Renal U/S showed Focal lobulated contour within the outer cortex of the LEFT kidney, of uncertain significance, isoechoic renal mass not excluded. No acute findings noted and no hydronephrosis.  -Will order a CT Renal Stone Study to further evaluate   Tobacco Abuse/Smoker -Smoking cessation  counseling given -We will start patient on a Nicotine Patch 20 mg transdermally  Leukocytosis -Likely reactive in the setting of dehydration from nausea, vomiting, and being out in the sun -WBC is improved from 15.3 and is now 5.3 after IV fluid hydration -Continue to monitor for signs and symptoms of infection -Repeat CBC in the a.m.  Abnormal  AST -Patient's AST went from 54 is now 22 -Likely in the setting of rhabdomyolysis -Continue to monitor LFTs if continues to worsen we will get a  left upper quadrant ultrasound as well as an acute hepatitis panel -Repeat CMP in the a.m.  Hyperglycemia in the Setting of Pre-Diabetes/IFG -Blood Sugar ranging from 103-181 -Checked HbA1c and was 6.3 -Continue to Monitor Blood Sugar on Daily BMP/CMP -Will place on Sensitive Novolog SSI AC  DVT prophylaxis: Heparin 5,000 sq q8h Code Status: FULL CODE Family Communication: No family present at bedside  Disposition Plan: Remain Inpatient for continued workup and treatement  Consultants:   None   Procedures: None   Antimicrobials:  Anti-infectives (From admission, onward)   None     Subjective: Seen and examined at bedside and states he is still having some muscle aches in his left flank and back was hurting significantly.  No chest pain, shortness breath, lightheadedness or dizziness.  States  he did not sleep very well and cannot get comfortable.  No other concerns or complaints at this time  Objective: Vitals:   05/23/18 1700 05/23/18 2043 05/24/18 0526 05/24/18 1040  BP: (!) 137/92 (!) 136/91 (!) 134/96 (!) 157/102  Pulse: 73 72 62 61  Resp: 18 16 16 18   Temp: 100 F (37.8 C) 98 F (36.7 C) 97.6 F (36.4 C) 98.5 F (36.9 C)  TempSrc: Oral Oral Oral Oral  SpO2: 98% 99% 98% 99%  Weight:      Height:        Intake/Output Summary (Last 24 hours) at 05/24/2018 1449 Last data filed at 05/24/2018 1245 Gross per 24 hour  Intake 3423.67 ml  Output 3475 ml  Net -51.33 ml   Filed Weights   05/22/18 2031  Weight: 81.6 kg (180 lb)   Examination: Physical Exam:  Constitutional: WN/WD Caucasian male in NAD and appears calm  Eyes: Lids and conjunctivae normal, sclerae anicteric  ENMT: External Ears, Nose appear normal. Grossly normal hearing.  Neck: Appears normal, supple, no cervical masses, normal ROM, no appreciable  thyromegaly, no JVD Respiratory: Diminished to auscultation bilaterally, no wheezing, rales, rhonchi or crackles. Normal respiratory effort and patient is not tachypenic. No accessory muscle use.  Cardiovascular: RRR, no murmurs / rubs / gallops. S1 and S2 auscultated. No extremity edema.   Abdomen: Soft, non-tender, non-distended. No masses palpated. No appreciable hepatosplenomegaly. Bowel sounds positive x4.  GU: Deferred. Musculoskeletal: No clubbing / cyanosis of digits/nails. No joint deformity upper and lower extremities.  Skin: No rashes, lesions, ulcers on a limited skin eval. No induration; Warm and dry.  Neurologic: CN 2-12 grossly intact with no focal deficits. Romberg sign and cerebellar reflexes not assessed.  Psychiatric: Normal judgment and insight. Alert and oriented x 3. Normal mood and appropriate affect.   Data Reviewed: I have personally reviewed following labs and imaging studies  CBC: Recent Labs  Lab 05/22/18 2132 05/23/18 0232 05/24/18 0720  WBC 15.3* 11.8* 5.3  NEUTROABS 10.5*  --  2.6  HGB 15.7 14.5 13.2  HCT 46.8 43.1 40.3  MCV 86.3 85.9 87.0  PLT 241 244 786   Basic Metabolic Panel:  Recent Labs  Lab 05/22/18 2330 05/23/18 0232 05/24/18 0720  NA 135 136 142  K 3.5 3.6 4.1  CL 101 102 112*  CO2 23 25 26   GLUCOSE 103* 181* 106*  BUN 23* 22* 13  CREATININE 2.97* 2.69* 1.44*  CALCIUM 8.9 8.8* 8.4*  MG  --   --  1.8  PHOS  --   --  2.9   GFR: Estimated Creatinine Clearance: 72.6 mL/min (A) (by C-G formula based on SCr of 1.44 mg/dL (H)). Liver Function Tests: Recent Labs  Lab 05/23/18 0232 05/24/18 0720  AST 54* 81*  ALT 21 30  ALKPHOS 65 51  BILITOT 0.9 0.4  PROT 6.9 5.7*  ALBUMIN 3.7 2.9*   No results for input(s): LIPASE, AMYLASE in the last 168 hours. No results for input(s): AMMONIA in the last 168 hours. Coagulation Profile: No results for input(s): INR, PROTIME in the last 168 hours. Cardiac Enzymes: Recent Labs  Lab  05/22/18 2330 05/23/18 0232 05/24/18 0720  CKTOTAL 3,265* 3,872* 3,981*  CKMB  --  15.2*  --    BNP (last 3 results) No results for input(s): PROBNP in the last 8760 hours. HbA1C: Recent Labs    05/24/18 0720  HGBA1C 6.3*   CBG: No results for input(s): GLUCAP in the last 168 hours. Lipid Profile: No results for input(s): CHOL, HDL, LDLCALC, TRIG, CHOLHDL, LDLDIRECT in the last 72 hours. Thyroid Function Tests: Recent Labs    05/23/18 0232  TSH 1.117   Anemia Panel: No results for input(s): VITAMINB12, FOLATE, FERRITIN, TIBC, IRON, RETICCTPCT in the last 72 hours. Sepsis Labs: No results for input(s): PROCALCITON, LATICACIDVEN in the last 168 hours.  Recent Results (from the past 240 hour(s))  MRSA PCR Screening     Status: None   Collection Time: 05/23/18  3:31 AM  Result Value Ref Range Status   MRSA by PCR NEGATIVE NEGATIVE Final    Comment:        The GeneXpert MRSA Assay (FDA approved for NASAL specimens only), is one component of a comprehensive MRSA colonization surveillance program. It is not intended to diagnose MRSA infection nor to guide or monitor treatment for MRSA infections. Performed at [facility], [place] [address], [place], [place] [address]     Radiology Studies: US Renal  Result Date: 05/23/2018 CLINICAL DATA:  Acute kidney injury. Dysuria. Lower abdominal and back pain. EXAM: RENAL / URINARY TRACT ULTRASOUND COMPLETE COMPARISON:  None. FINDINGS: Right Kidney: Length: 10.8 cm. Cortical thickness and echogenicity is within normal limits. No mass or hydronephrosis visualized. Left Kidney: Length: 11.5 cm. Cortical thickness and echogenicity is within normal limits. No discrete mass, however, there is a focal lobular prominence within the outer cortex of the LEFT kidney, of uncertain significance, isoechoic mass not excluded. No hydronephrosis. Bladder: Appears normal for degree of bladder distention. IMPRESSION: 1. Focal lobulated contour  within the outer cortex of the LEFT kidney, of uncertain significance, isoechoic renal mass not excluded. Recommend further characterization with renal protocol CT or renal MRI if renal function allows. 2. No acute findings.  No hydronephrosis. Electronically Signed   By: Franki Cabot M.D.   On: 05/23/2018 13:41   Scheduled Meds: . amLODipine  10 mg Oral Daily  . cloNIDine  0.2 mg Oral BID  . heparin  5,000 Units Subcutaneous Q8H  . nicotine  21 mg Transdermal Daily   Continuous Infusions: . sodium chloride 125 mL/hr at 05/24/18 1315    LOS: 1 day  Kerney Elbe, DO [facility] Pager [phone]  If 7PM-7AM, please contact night-coverage [email or web address] Password [facility] 05/24/2018, 2:49 PM

## 2018-05-25 ENCOUNTER — Inpatient Hospital Stay (HOSPITAL_COMMUNITY): Payer: Self-pay

## 2018-05-25 DIAGNOSIS — R945 Abnormal results of liver function studies: Secondary | ICD-10-CM

## 2018-05-25 DIAGNOSIS — R748 Abnormal levels of other serum enzymes: Secondary | ICD-10-CM

## 2018-05-25 LAB — COMPREHENSIVE METABOLIC PANEL
ALBUMIN: 3.3 g/dL — AB (ref 3.5–5.0)
ALT: 45 U/L — ABNORMAL HIGH (ref 0–44)
ANION GAP: 6 (ref 5–15)
AST: 114 U/L — ABNORMAL HIGH (ref 15–41)
Alkaline Phosphatase: 56 U/L (ref 38–126)
BILIRUBIN TOTAL: 0.5 mg/dL (ref 0.3–1.2)
BUN: 8 mg/dL (ref 6–20)
CHLORIDE: 107 mmol/L (ref 98–111)
CO2: 29 mmol/L (ref 22–32)
Calcium: 9.2 mg/dL (ref 8.9–10.3)
Creatinine, Ser: 1.23 mg/dL (ref 0.61–1.24)
GFR calc Af Amer: >60 mL/min (ref >60)
Glucose, Bld: 118 mg/dL — ABNORMAL HIGH (ref 70–99)
POTASSIUM: 4 mmol/L (ref 3.5–5.1)
Sodium: 142 mmol/L (ref 135–145)
TOTAL PROTEIN: 6.9 g/dL (ref 6.5–8.1)

## 2018-05-25 LAB — CBC WITH DIFFERENTIAL/PLATELET
ABS IMMATURE GRANULOCYTES: 0 10*3/uL (ref 0.0–0.1)
BASOS PCT: 1 %
Basophils Absolute: 0 10*3/uL (ref 0.0–0.1)
Eosinophils Absolute: 0.1 10*3/uL (ref 0.0–0.7)
Eosinophils Relative: 2 %
HCT: 44.4 % (ref 39.0–52.0)
HEMOGLOBIN: 14.5 g/dL (ref 13.0–17.0)
IMMATURE GRANULOCYTES: 0 %
LYMPHS ABS: 2.1 10*3/uL (ref 0.7–4.0)
LYMPHS PCT: 34 %
MCH: 28.3 pg (ref 26.0–34.0)
MCHC: 32.7 g/dL (ref 30.0–36.0)
MCV: 86.7 fL (ref 78.0–100.0)
MONOS PCT: 8 %
Monocytes Absolute: 0.5 10*3/uL (ref 0.1–1.0)
NEUTROS ABS: 3.5 10*3/uL (ref 1.7–7.7)
NEUTROS PCT: 55 %
PLATELETS: 194 10*3/uL (ref 150–400)
RBC: 5.12 MIL/uL (ref 4.22–5.81)
RDW: 12 % (ref 11.5–15.5)
WBC: 6.3 10*3/uL (ref 4.0–10.5)

## 2018-05-25 LAB — PHOSPHORUS: Phosphorus: 3.8 mg/dL (ref 2.5–4.6)

## 2018-05-25 LAB — GLUCOSE, CAPILLARY
GLUCOSE-CAPILLARY: 169 mg/dL — AB (ref 70–99)
GLUCOSE-CAPILLARY: 103 mg/dL — AB (ref 70–99)
Glucose-Capillary: 107 mg/dL — ABNORMAL HIGH (ref 70–99)
Glucose-Capillary: 90 mg/dL (ref 70–99)

## 2018-05-25 LAB — MAGNESIUM: MAGNESIUM: 1.7 mg/dL (ref 1.7–2.4)

## 2018-05-25 LAB — CK: Total CK: 5330 U/L — ABNORMAL HIGH (ref 49–397)

## 2018-05-25 MED ORDER — TRAZODONE HCL 50 MG PO TABS
50.0000 mg | ORAL_TABLET | Freq: Every evening | ORAL | Status: DC | PRN
  Administered 2018-05-25 – 2018-05-27 (×3): 50 mg via ORAL
  Filled 2018-05-25 (×3): qty 1

## 2018-05-25 MED ORDER — SODIUM CHLORIDE 0.9 % IV SOLN
INTRAVENOUS | Status: AC
  Administered 2018-05-25 – 2018-05-27 (×6): via INTRAVENOUS

## 2018-05-25 NOTE — Progress Notes (Signed)
Isaac Snyder  QMG:[id number] DOB: 11/18/1976 DOA: 05/22/2018 PCP: No primary care provider on file.   Brief Narrative:  The patient is a 42 year old Caucasian male with a past medical history significant for attention deficit disorder, acute renal failure, with prior rhabdomyolysis, and history of reported MI along with other comorbidities who presented to [facility] emergency room with a chief complaint of not feeling well and being dehydrated.    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  Patient states that he also had some back pain and muscle aches and felt similar to last years episode.  Initial work-up in the emergency room found the patient had acute renal failure and elevated CK he was admitted for acute renal failure with rhabdomyolysis.  He was given normal saline at a rate of 200 mL/hr however it was reduced to 125 mL's per hour.    Patient's Adderall (takes 20 mg po BID) was stopped however we will give him one-time dose again this morning and continue to monitor as a been case reports of Adderall and rhabdomyolysis. His renal function is improving from yesterday however his CK still remains quite elevated and higher than when he came in.  White blood cell count is trending down. Checked a renal ultrasound because of his acute kidney injury as well as obtain urine electrolytes. Patient still complaining of back pain specifically on left side though. Renal U/S showed Focal lobulated contour within the outer cortex of the LEFT kidney, of uncertain significance, isoechoic renal mass not excluded. No acute findings noted and no hydronephrosis.  Because of this focal lobulated contour with an outward of the left kidney we will obtain a CT renal protocol to  evaluate further.  Renal CT did not show any masses but did show that he hadScarring/cortical thinning at inferior LEFT kidney with adjacent mild lobular prominence of the cortex of the mid LEFT kidney.  CK continues to rise so we will stop Adderall altogether and continue with IV fluid hydration.  Hospitalization also complicated by abnormal and worsening LFTs.  Assessment & Plan:   Principal Problem:   ARF (acute renal failure) (HCC) Active Problems:   Rhabdomyolysis  AKI, improving -BUN/Cr went from 23/2.97 on Admission and is now improved to 8/1.23 -Checked renal ultrasound and showed Focal lobulated contour within the outer cortex of the LEFT kidney, of uncertain significance, isoechoic renal mass not excluded. No acute findings noted and no hydronephrosis.  -Checked Urinalysis and showed the appearance, moderate hemoglobin, 5 ketones, negative leukocytes, negative nitrites, rare bacteria 0-5 RBCs and WBCs -Urine Sodium was 34, Urine Creatinine was 127.1, Urine Eosinophils -C/w NS at a rate of 125 mL/hr -Continue to Monitor and repeat CMP in AM   Rhabdomyolysis, -Stopped ADDERALL and will not give again; Stopped GABAPENTIN (case reports of adderall and rhabdo) -C/w NS at a rate of 125 mL/hr -ESR was 34 -CK is worsening and went from 3265 on admission is now 5,330 -Repeat CK in AM   Hypertension -C/w Clonidine 0.2 po BID -C/w Amlodipine 10 mg po Daily  -Will add IV Hydralazine 10 mg q6hprn for SBP>180 or DBP>100  Chronic Pain; Acute Left Flank Pain -C/w Oxycodone IR 5 mg po 4 times daily pRN -Added Fentanyl 12.5 mcg  IV q2hprn Severe Pain not relieved by Oxycodone IR -Renal U/S showed Focal lobulated contour within the outer cortex of the LEFT kidney, of uncertain significance, isoechoic renal mass not excluded. No acute findings noted and no hydronephrosis.  -CT Renal Stone Study to further evaluate showed Scarring/cortical thinning at inferior LEFT kidney with adjacent mild  lobular prominence of the cortex of the mid LEFT kidney. No definite renal mass identified. Small BILATERAL inguinal hernias containing fat. -Continue to Monitor   Tobacco Abuse/Smoker -Smoking cessation counseling given -We will start patient on a Nicotine Patch 53m transdermally  Leukocytosis -Likely reactive in the setting of dehydration from nausea, vomiting, and being out in the sun -WBC is improved from 15.3 and is now 6.3 after IV fluid hydration -Continue to monitor for signs and symptoms of infection -Repeat CBC in the a.m.  Abnormal LFT's/Transaminitis  -Patient's AST went from 54 is now 114 -ALT went from 21 and increased to 45 -Likely in the setting of rhabdomyolysis -Patient has a Hx of Hepatitis C s/p Treatment with Harvoni -Continue to monitor LFTs if  -Will get a  left upper quadrant ultrasound as well as an acute hepatitis panel given worsening LFTs -Repeat CMP in the a.m.  Hyperglycemia in the Setting of Pre-Diabetes/IFG -Blood Sugar ranging from 103-181 -Checked HbA1c and was 6.3 -Continue to Monitor Blood Sugar on Daily BMP/CMP -Will place on Sensitive Novolog SSI AC  DVT prophylaxis: Heparin 5,000 sq q8h Code Status: FULL CODE Family Communication: No family present at bedside  Disposition Plan: Remain Inpatient for continued workup and treatement  Consultants:   None   Procedures: None   Antimicrobials:  Anti-infectives (From admission, onward)   None     Subjective: Seen and examined at bedside and she had a pretty good night but still sore.  No chest pain, shortness breath, nausea, vomiting.  Still having some left flank pain and.  No other concerns or complaints at this time.  Objective: Vitals:   05/24/18 1659 05/24/18 2053 05/25/18 0534 05/25/18 0925  BP: 117/74 (!) 151/102 131/81 (!) 156/97  Pulse: 78 73 67   Resp: 20 18 16    Temp: 98.2 F (36.8 C) 98.9 F (37.2 C) 98.3 F (36.8 C)   TempSrc: Oral Oral    SpO2: 100% 99% 99%     Weight:      Height:        Intake/Output Summary (Last 24 hours) at 05/25/2018 1143 Last data filed at 05/25/2018 0900 Gross per 24 hour  Intake 1909.22 ml  Output 1825 ml  Net 84.22 ml   Filed Weights   05/22/18 2031  Weight: 81.6 kg (180 lb)   Examination: Physical Exam:  Constitutional: Patient is a well-nourished, well-developed Caucasian male is currently no acute distress and laying in bed appears, comfortable Eyes: Sclera anicteric.  Lids and intervals are normal ENMT: External ears and nose appear normal.  Grossly normal hearing Neck: Neck is supple no JVD Respiratory: Slightly diminished to auscultation no appreciable wheezing, rales, rhonchi.  Patient was not tachypneic using accessory muscle breathe Cardiovascular: Regular rate and rhythm.  No appreciable murmurs, rubs, gallops.  No lower extremity edema noted Abdomen: Soft, nontender, nondistended.  Bowel sounds present all 4 quadrants GU: Deferred Musculoskeletal: No contractures or cyanosis.  No joint deformities noted Skin: Skin is warm and dry no appreciable rashes or lesions for skin evaluation.  Patient does have multiple tattoos noted Neurologic: Cranial nerves II through XII grossly intact no appreciable focal deficits Psychiatric:  Normal mood and affect.  Intact judgment and insight.  Patient is awake, alert, and oriented x3  Data Reviewed: I have personally reviewed following labs and imaging studies  CBC: Recent Labs  Lab 05/22/18 2132 05-30-18 0232 05/24/18 0720 05/25/18 0629  WBC 15.3* 11.8* 5.3 6.3  NEUTROABS 10.5*  --  2.6 3.5  HGB 15.7 14.5 13.2 14.5  HCT 46.8 43.1 40.3 44.4  MCV 86.3 85.9 87.0 86.7  PLT 241 244 183 322   Basic Metabolic Panel: Recent Labs  Lab 05/22/18 2330 May 30, 2018 0232 05/24/18 0720 05/25/18 0629  NA 135 136 142 142  K 3.5 3.6 4.1 4.0  CL 101 102 112* 107  CO2 23 25 26 29   GLUCOSE 103* 181* 106* 118*  BUN 23* 22* 13 8  CREATININE 2.97* 2.69* 1.44* 1.23   CALCIUM 8.9 8.8* 8.4* 9.2  MG  --   --  1.8 1.7  PHOS  --   --  2.9 3.8   GFR: Estimated Creatinine Clearance: 85 mL/min (by C-G formula based on SCr of 1.23 mg/dL). Liver Function Tests: Recent Labs  Lab 2018-05-30 0232 05/24/18 0720 05/25/18 0629  AST 54* 81* 114*  ALT 21 30 45*  ALKPHOS 65 51 56  BILITOT 0.9 0.4 0.5  PROT 6.9 5.7* 6.9  ALBUMIN 3.7 2.9* 3.3*   No results for input(s): LIPASE, AMYLASE in the last 168 hours. No results for input(s): AMMONIA in the last 168 hours. Coagulation Profile: No results for input(s): INR, PROTIME in the last 168 hours. Cardiac Enzymes: Recent Labs  Lab 05/22/18 2330 May 30, 2018 0232 05/24/18 0720 05/25/18 0836  CKTOTAL 3,265* 3,872* 3,981* 5,330*  CKMB  --  15.2*  --   --    BNP (last 3 results) No results for input(s): PROBNP in the last 8760 hours. HbA1C: Recent Labs    05/24/18 0720  HGBA1C 6.3*   CBG: Recent Labs  Lab 05/24/18 1657 05/24/18 2049 05/25/18 0737  GLUCAP 117* 127* 107*   Lipid Profile: No results for input(s): CHOL, HDL, LDLCALC, TRIG, CHOLHDL, LDLDIRECT in the last 72 hours. Thyroid Function Tests: Recent Labs    May 30, 2018 0232  TSH 1.117   Anemia Panel: No results for input(s): VITAMINB12, FOLATE, FERRITIN, TIBC, IRON, RETICCTPCT in the last 72 hours. Sepsis Labs: No results for input(s): PROCALCITON, LATICACIDVEN in the last 168 hours.  Recent Results (from the past 240 hour(s))  MRSA PCR Screening     Status: None   Collection Time: May 30, 2018  3:31 AM  Result Value Ref Range Status   MRSA by PCR NEGATIVE NEGATIVE Final    Comment:        The GeneXpert MRSA Assay (FDA approved for NASAL specimens only), is one component of a comprehensive MRSA colonization surveillance program. It is not intended to diagnose MRSA infection nor to guide or monitor treatment for MRSA infections. Performed at [facility], [place] [address], [place], [place] [address]     Radiology Studies: US  Renal  Result Date: 05-30-2018 CLINICAL DATA:  Acute kidney injury. Dysuria. Lower abdominal and back pain. EXAM: RENAL / URINARY TRACT ULTRASOUND COMPLETE COMPARISON:  None. FINDINGS: Right Kidney: Length: 10.8 cm. Cortical thickness and echogenicity is within normal limits. No mass or hydronephrosis visualized. Left Kidney: Length: 11.5 cm. Cortical thickness and echogenicity is within normal limits. No discrete mass, however, there is a focal lobular prominence within the outer cortex of the LEFT kidney, of uncertain significance, isoechoic mass not excluded. No hydronephrosis. Bladder:  Appears normal for degree of bladder distention. IMPRESSION: 1. Focal lobulated contour within the outer cortex of the LEFT kidney, of uncertain significance, isoechoic renal mass not excluded. Recommend further characterization with renal protocol CT or renal MRI if renal function allows. 2. No acute findings.  No hydronephrosis. Electronically Signed   By: Franki Cabot M.D.   On: 05/23/2018 13:41   Ct Renal Stone Study  Result Date: 05/24/2018 CLINICAL DATA:  Acute renal failure, lobular prominence of LEFT kidney on ultrasound EXAM: CT ABDOMEN AND PELVIS WITHOUT CONTRAST TECHNIQUE: Multidetector CT imaging of the abdomen and pelvis was performed following the standard protocol without IV contrast. Sagittal and coronal MPR images reconstructed from axial data set. COMPARISON:  Ultrasound kidneys 05/23/2018 FINDINGS: Lower chest: Minimal atelectasis dependently LEFT lung base. Remaining lung bases clear. Hepatobiliary: Contracted gallbladder.  Liver unremarkable. Pancreas: Atrophic pancreas Spleen: Normal appearance Adrenals/Urinary Tract: Adrenal glands normal appearance. Limited assessment of the kidneys due to lack of IV contrast. No definite renal mass lesion identified. Small cortical scar/cortical thinning inferior pole LEFT kidney adjacent mild lobular prominence of mid LEFT kidney, ureters and bladder otherwise  unremarkable. Renal cortex. Stomach/Bowel: Normal appendix. Incomplete distention of the sigmoid colon and rectum limiting assessment. Stomach and bowel loops otherwise unremarkable. Vascular/Lymphatic: Atherosclerotic calcifications aorta. Aorta normal caliber. No adenopathy. Reproductive: Unremarkable Other: Small BILATERAL inguinal hernias containing fat larger on RIGHT. No free air, free fluid or inflammatory process. Musculoskeletal: Normal appearance IMPRESSION: Scarring/cortical thinning at inferior LEFT kidney with adjacent mild lobular prominence of the cortex of the mid LEFT kidney. No definite renal mass identified. Small BILATERAL inguinal hernias containing fat. Electronically Signed   By: Lavonia Dana M.D.   On: 05/24/2018 18:57   Scheduled Meds: . amLODipine  10 mg Oral Daily  . cloNIDine  0.2 mg Oral BID  . heparin  5,000 Units Subcutaneous Q8H  . insulin aspart  0-9 Units Subcutaneous TID WC  . nicotine  21 mg Transdermal Daily   Continuous Infusions:   LOS: 2 days   Kerney Elbe, DO [facility] Pager [phone]  If 7PM-7AM, please contact night-coverage [email or web address] Password [facility] 05/25/2018, 11:43 AM

## 2018-05-26 LAB — CBC WITH DIFFERENTIAL/PLATELET
ABS IMMATURE GRANULOCYTES: 0 10*3/uL (ref 0.0–0.1)
BASOS ABS: 0 10*3/uL (ref 0.0–0.1)
BASOS PCT: 0 %
Eosinophils Absolute: 0.1 10*3/uL (ref 0.0–0.7)
Eosinophils Relative: 2 %
HCT: 41.4 % (ref 39.0–52.0)
Hemoglobin: 14 g/dL (ref 13.0–17.0)
IMMATURE GRANULOCYTES: 0 %
Lymphocytes Relative: 37 %
Lymphs Abs: 2.6 10*3/uL (ref 0.7–4.0)
MCH: 28.9 pg (ref 26.0–34.0)
MCHC: 33.8 g/dL (ref 30.0–36.0)
MCV: 85.5 fL (ref 78.0–100.0)
Monocytes Absolute: 0.5 10*3/uL (ref 0.1–1.0)
Monocytes Relative: 7 %
NEUTROS ABS: 3.9 10*3/uL (ref 1.7–7.7)
NEUTROS PCT: 54 %
PLATELETS: 194 10*3/uL (ref 150–400)
RBC: 4.84 MIL/uL (ref 4.22–5.81)
RDW: 11.9 % (ref 11.5–15.5)
WBC: 7.2 10*3/uL (ref 4.0–10.5)

## 2018-05-26 LAB — COMPREHENSIVE METABOLIC PANEL
ALT: 52 U/L — AB (ref 0–44)
AST: 122 U/L — ABNORMAL HIGH (ref 15–41)
Albumin: 3.3 g/dL — ABNORMAL LOW (ref 3.5–5.0)
Alkaline Phosphatase: 58 U/L (ref 38–126)
Anion gap: 6 (ref 5–15)
BUN: 11 mg/dL (ref 6–20)
CHLORIDE: 106 mmol/L (ref 98–111)
CO2: 26 mmol/L (ref 22–32)
Calcium: 9.2 mg/dL (ref 8.9–10.3)
Creatinine, Ser: 1.26 mg/dL — ABNORMAL HIGH (ref 0.61–1.24)
GFR calc Af Amer: >60 mL/min (ref >60)
GFR calc non Af Amer: >60 mL/min (ref >60)
Glucose, Bld: 114 mg/dL — ABNORMAL HIGH (ref 70–99)
POTASSIUM: 3.8 mmol/L (ref 3.5–5.1)
SODIUM: 138 mmol/L (ref 135–145)
Total Bilirubin: 0.6 mg/dL (ref 0.3–1.2)
Total Protein: 6.3 g/dL — ABNORMAL LOW (ref 6.5–8.1)

## 2018-05-26 LAB — GLUCOSE, CAPILLARY
GLUCOSE-CAPILLARY: 148 mg/dL — AB (ref 70–99)
GLUCOSE-CAPILLARY: 137 mg/dL — AB (ref 70–99)
Glucose-Capillary: 116 mg/dL — ABNORMAL HIGH (ref 70–99)
Glucose-Capillary: 138 mg/dL — ABNORMAL HIGH (ref 70–99)

## 2018-05-26 LAB — HEPATITIS PANEL, ACUTE
HCV Ab: >11 s/co ratio — ABNORMAL HIGH (ref 0.0–0.9)
HEP A IGM: NEGATIVE
HEP B C IGM: NEGATIVE
Hepatitis B Surface Ag: NEGATIVE

## 2018-05-26 LAB — CK: Total CK: 3195 U/L — ABNORMAL HIGH (ref 49–397)

## 2018-05-26 LAB — PHOSPHORUS: PHOSPHORUS: 3.6 mg/dL (ref 2.5–4.6)

## 2018-05-26 LAB — MAGNESIUM: MAGNESIUM: 1.8 mg/dL (ref 1.7–2.4)

## 2018-05-26 NOTE — Progress Notes (Signed)
PROGRESS NOTE    Isaac Snyder  GNF:[id number] DOB: 1976-10-24 DOA: 05/22/2018 PCP: No primary care provider on file.   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  Patient states that he also had some back pain and muscle aches and felt similar to last years episode.  Initial work-up in the emergency room found the patient had acute renal failure and elevated CK he was admitted for acute renal failure with rhabdomyolysis.  He was given normal saline at a rate of 200 mL/hr however it was reduced to 125 mL's per hour.    Patient's Adderall (takes 20 mg po BID) was stopped however we will give him one-time dose again this morning and continue to monitor as a been case reports of Adderall and rhabdomyolysis. His renal function is improving from yesterday however his CK still remains quite elevated and higher than when he came in.  White blood cell count is trending down. Checked a renal ultrasound because of his acute kidney injury as well as obtain urine electrolytes. Patient still complaining of back pain specifically on left side though. Renal U/S showed Focal lobulated contour within the outer cortex of the LEFT kidney, of uncertain significance, isoechoic renal mass not excluded. No acute findings noted and no hydronephrosis.  Because of this focal lobulated contour with an outward of the left kidney we will obtain a CT renal protocol to  evaluate further.  Renal CT did not show any masses but did show that he hadScarring/cortical thinning at inferior LEFT kidney with adjacent mild lobular prominence of the cortex of the mid LEFT kidney.  CK continued to rise so we will stop Adderall altogether and continue with IV fluid hydration.  Hospitalization also complicated by abnormal and worsening LFTs. RUQ showed Hepatic Steatosis; HCV Ab is >11.0.   Assessment & Plan:   Principal Problem:   ARF (acute renal failure) (HCC) Active Problems:   Rhabdomyolysis  AKI, improving -BUN/Cr went from 23/2.97 on Admission and is now improved to 11/1.26 -Checked renal ultrasound and showed Focal lobulated contour within the outer cortex of the LEFT kidney, of uncertain significance, isoechoic renal mass not excluded. No acute findings noted and no hydronephrosis.  -Checked Urinalysis and showed the appearance, moderate hemoglobin, 5 ketones, negative leukocytes, negative nitrites, rare bacteria 0-5 RBCs and WBCs -Urine Sodium was 34, Urine Creatinine was 127.1, Urine Eosinophils -C/w NS at a rate of 125 mL/hr x 2 days  -Continue to Monitor and repeat CMP in AM   Rhabdomyolysis, -Stopped ADDERALL and will not give again; Stopped GABAPENTIN (case reports of adderall and rhabdo) -C/w NS at a rate of 125 mL/hr -ESR was 34 -CK was worsening and went from 3265 on admission and worsened to 5,330; CK Now trending down and is 3,195. -Repeat CK in AM  -Ambulate the Patient in Halls  Hypertension -C/w Clonidine 0.2 po BID -C/w Amlodipine 10 mg po Daily  -Will add IV Hydralazine 10 mg  q6hprn for SBP>180 or DBP>100  Chronic Pain; Acute Left Flank Pain -C/w Oxycodone IR 5 mg po 4 times daily pRN -Added Fentanyl 12.5 mcg IV q2hprn Severe Pain not relieved by Oxycodone IR -Renal U/S showed Focal lobulated contour within the outer cortex of the LEFT kidney, of uncertain significance, isoechoic renal mass not excluded. No acute findings noted and no  hydronephrosis.  -CT Renal Stone Study to further evaluate showed Scarring/cortical thinning at inferior LEFT kidney with adjacent mild lobular prominence of the cortex of the mid LEFT kidney. No definite renal mass identified. Small BILATERAL inguinal hernias containing fat. -Continue to Monitor closely   Tobacco Abuse/Smoker -Smoking cessation counseling given -We will start patient on a Nicotine Patch 55m transdermally  Leukocytosis -Likely reactive in the setting of dehydration from nausea, vomiting, and being out in the sun -WBC is improved from 15.3 and is now 7.3 after IV fluid hydration -Continue to monitor for signs and symptoms of infection -Repeat CBC in the a.m.  Abnormal LFT's/Transaminitis  -Patient's AST went from 54 is now 122 -ALT went from 21 and increased to 52 -Likely in the setting of rhabdomyolysis -Patient has a Hx of Hepatitis C s/p Treatment with Harvoni -Continue to monitor LFTs  -Obtained a Right upper quadrant ultrasound and showed Increase in liver echogenicity, a finding felt to be indicative of a degree of hepatic steatosis. While no focal liver lesions are evident on this study, it must be cautioned that the sensitivity of ultrasound for detection of focal liver lesions is diminished in this circumstance. Study otherwise unremarkable. -Acute hepatitis panel given worsening LFTs; Showed HCV >11.0 and will check HVC Quantitative to evaluate if has responded to Harvoni  -Repeat CMP in the a.m.  Hyperglycemia in the Setting of Pre-Diabetes/IFG -Blood Sugar ranging from 103-181 -Checked HbA1c and was 6.3 -Continue to Monitor Blood Sugar on Daily BMP/CMP -Placed on Sensitive Novolog SSI AC  DVT prophylaxis: Heparin 5,000 sq q8h Code Status: FULL CODE Family Communication: No family present at bedside  Disposition Plan: Remain Inpatient for continued workup and treatement  Consultants:   None   Procedures: None   Antimicrobials:    Anti-infectives (From admission, onward)   None     Subjective: Seen and examined at bedside and states he felt slightly better but not much improved. No CP, SOB, Nausea or Vomiting. Still stated back pain was there but slightly better. Wanted to know if he could have his Adderall.   Objective: Vitals:   05/25/18 1608 05/25/18 2055 05/26/18 0457 05/26/18 0934  BP: 118/70 (!) 122/91 109/69 109/66  Pulse: 62 (!) 59 61 69  Resp:  18 19 18   Temp: 98.4 F (36.9 C) 98.3 F (36.8 C) 97.8 F (36.6 C) 98.3 F (36.8 C)  TempSrc: Oral Oral Oral Oral  SpO2: 95% 98% 98% 97%  Weight:      Height:        Intake/Output Summary (Last 24 hours) at 05/26/2018 1240 Last data filed at 05/26/2018 1200 Gross per 24 hour  Intake 2248.48 ml  Output 1575 ml  Net 673.48 ml   Filed Weights   05/22/18 2031  Weight: 81.6 kg (180 lb)   Examination: Physical Exam:  Constitutional: Patient is a well-nourished, well-developed Caucasian male currently in no acute distress laying in bed appears calm Eyes: Sclera anicteric.  Lids and conjunctive are normal ENMT: External ears and nose appear normal.  Grossly normal hearing Neck: Appears supple with no JVD  Respiratory: Slightly diminished to  auscultation but no appreciable wheezing, rales, rhonchi.  Patient is not tachypneic using accessory muscle breathe Cardiovascular: Regular rate and rhythm.  No appreciable murmurs, rubs, gallops.  No extremity edema noted Abdomen: Soft, nontender, nondistended.  Bowel sounds present all 4 quadrants GU: Deferred Musculoskeletal: No contractures or cyanosis.  No joint deformities noted Skin: Skin is warm and dry no appreciable rashes or lesions on limited skin evaluation does have multiple tattoos noted in the upper chest and arms Neurologic: Cranial nerves II through XII grossly intact with no appreciable focal deficits Psychiatric: Normal mood and affect.  Intact judgment and insight.  Patient is awake, alert and  oriented x3  Data Reviewed: I have personally reviewed following labs and imaging studies  CBC: Recent Labs  Lab 05/22/18 2132 05/23/18 0232 05/24/18 0720 05/25/18 0629 05/26/18 0502  WBC 15.3* 11.8* 5.3 6.3 7.2  NEUTROABS 10.5*  --  2.6 3.5 3.9  HGB 15.7 14.5 13.2 14.5 14.0  HCT 46.8 43.1 40.3 44.4 41.4  MCV 86.3 85.9 87.0 86.7 85.5  PLT 241 244 183 194 865   Basic Metabolic Panel: Recent Labs  Lab 05/22/18 2330 05/23/18 0232 05/24/18 0720 05/25/18 0629 05/26/18 0502  NA 135 136 142 142 138  K 3.5 3.6 4.1 4.0 3.8  CL 101 102 112* 107 106  CO2 23 25 26 29 26   GLUCOSE 103* 181* 106* 118* 114*  BUN 23* 22* 13 8 11   CREATININE 2.97* 2.69* 1.44* 1.23 1.26*  CALCIUM 8.9 8.8* 8.4* 9.2 9.2  MG  --   --  1.8 1.7 1.8  PHOS  --   --  2.9 3.8 3.6   GFR: Estimated Creatinine Clearance: 83 mL/min (A) (by C-G formula based on SCr of 1.26 mg/dL (H)). Liver Function Tests: Recent Labs  Lab 05/23/18 0232 05/24/18 0720 05/25/18 0629 05/26/18 0502  AST 54* 81* 114* 122*  ALT 21 30 45* 52*  ALKPHOS 65 51 56 58  BILITOT 0.9 0.4 0.5 0.6  PROT 6.9 5.7* 6.9 6.3*  ALBUMIN 3.7 2.9* 3.3* 3.3*   No results for input(s): LIPASE, AMYLASE in the last 168 hours. No results for input(s): AMMONIA in the last 168 hours. Coagulation Profile: No results for input(s): INR, PROTIME in the last 168 hours. Cardiac Enzymes: Recent Labs  Lab 05/22/18 2330 05/23/18 0232 05/24/18 0720 05/25/18 0836 05/26/18 0502  CKTOTAL 3,265* 3,872* 3,981* 5,330* 3,195*  CKMB  --  15.2*  --   --   --    BNP (last 3 results) No results for input(s): PROBNP in the last 8760 hours. HbA1C: Recent Labs    05/24/18 0720  HGBA1C 6.3*   CBG: Recent Labs  Lab 05/25/18 1309 05/25/18 1653 05/25/18 2053 05/26/18 0738 05/26/18 1124  GLUCAP 169* 103* 90 148* 137*   Lipid Profile: No results for input(s): CHOL, HDL, LDLCALC, TRIG, CHOLHDL, LDLDIRECT in the last 72 hours. Thyroid Function Tests: No  results for input(s): TSH, T4TOTAL, FREET4, T3FREE, THYROIDAB in the last 72 hours. Anemia Panel: No results for input(s): VITAMINB12, FOLATE, FERRITIN, TIBC, IRON, RETICCTPCT in the last 72 hours. Sepsis Labs: No results for input(s): PROCALCITON, LATICACIDVEN in the last 168 hours.  Recent Results (from the past 240 hour(s))  MRSA PCR Screening     Status: None   Collection Time: 05/23/18  3:31 AM  Result Value Ref Range Status   MRSA by PCR NEGATIVE NEGATIVE Final    Comment:        The GeneXpert MRSA Assay (FDA  approved for NASAL specimens only), is one component of a comprehensive MRSA colonization surveillance program. It is not intended to diagnose MRSA infection nor to guide or monitor treatment for MRSA infections. Performed at [facility], [place] [address] [place], [place] [address]     Radiology Studies: Ct Renal Stone Study  Result Date: 05/24/2018 CLINICAL DATA:  Acute renal failure, lobular prominence of LEFT kidney on ultrasound EXAM: CT ABDOMEN AND PELVIS WITHOUT CONTRAST TECHNIQUE: Multidetector CT imaging of the abdomen and pelvis was performed following the standard protocol without IV contrast. Sagittal and coronal MPR images reconstructed from axial data set. COMPARISON:  Ultrasound kidneys 05/23/2018 FINDINGS: Lower chest: Minimal atelectasis dependently LEFT lung base. Remaining lung bases clear. Hepatobiliary: Contracted gallbladder.  Liver unremarkable. Pancreas: Atrophic pancreas Spleen: Normal appearance Adrenals/Urinary Tract: Adrenal glands normal appearance. Limited assessment of the kidneys due to lack of IV contrast. No definite renal mass lesion identified. Small cortical scar/cortical thinning inferior pole LEFT kidney adjacent mild lobular prominence of mid LEFT kidney, ureters and bladder otherwise unremarkable. Renal cortex. Stomach/Bowel: Normal appendix. Incomplete distention of the sigmoid colon and rectum limiting assessment. Stomach and bowel  loops otherwise unremarkable. Vascular/Lymphatic: Atherosclerotic calcifications aorta. Aorta normal caliber. No adenopathy. Reproductive: Unremarkable Other: Small BILATERAL inguinal hernias containing fat larger on RIGHT. No free air, free fluid or inflammatory process. Musculoskeletal: Normal appearance IMPRESSION: Scarring/cortical thinning at inferior LEFT kidney with adjacent mild lobular prominence of the cortex of the mid LEFT kidney. No definite renal mass identified. Small BILATERAL inguinal hernias containing fat. Electronically Signed   By: Lavonia Dana M.D.   On: 05/24/2018 18:57   US Abdomen Limited Ruq  Result Date: 05/25/2018 CLINICAL DATA:  Elevated liver enzymes EXAM: ULTRASOUND ABDOMEN LIMITED RIGHT UPPER QUADRANT COMPARISON:  CT abdomen and pelvis September 24, 2018 FINDINGS: Gallbladder: No gallstones or wall thickening visualized. There is no pericholecystic fluid. No sonographic Murphy sign noted by sonographer. Common bile duct: Diameter: 4 mm. No intrahepatic or extrahepatic biliary duct dilatation. Liver: No focal lesion identified. Liver echogenicity overall is increased. Portal vein is patent on color Doppler imaging with normal direction of blood flow towards the liver. IMPRESSION: Increase in liver echogenicity, a finding felt to be indicative of a degree of hepatic steatosis. While no focal liver lesions are evident on this study, it must be cautioned that the sensitivity of ultrasound for detection of focal liver lesions is diminished in this circumstance. Study otherwise unremarkable. Electronically Signed   By: Lowella Grip III M.D.   On: 05/25/2018 12:25   Scheduled Meds: . amLODipine  10 mg Oral Daily  . cloNIDine  0.2 mg Oral BID  . heparin  5,000 Units Subcutaneous Q8H  . insulin aspart  0-9 Units Subcutaneous TID WC  . nicotine  21 mg Transdermal Daily   Continuous Infusions: . sodium chloride 125 mL/hr at 05/26/18 0955    LOS: 3 days   Kerney Elbe,  DO [facility] Pager [phone]  If 7PM-7AM, please contact night-coverage [email or web address] Password [email or web address] 05/26/2018, 12:40 PM

## 2018-05-27 DIAGNOSIS — F988 Other specified behavioral and emotional disorders with onset usually occurring in childhood and adolescence: Secondary | ICD-10-CM

## 2018-05-27 DIAGNOSIS — D72819 Decreased white blood cell count, unspecified: Secondary | ICD-10-CM

## 2018-05-27 LAB — CBC WITH DIFFERENTIAL/PLATELET
Abs Immature Granulocytes: 0 10*3/uL (ref 0.0–0.1)
Basophils Absolute: 0 10*3/uL (ref 0.0–0.1)
Basophils Relative: 1 %
EOS ABS: 0.1 10*3/uL (ref 0.0–0.7)
Eosinophils Relative: 1 %
HEMATOCRIT: 42.4 % (ref 39.0–52.0)
Hemoglobin: 14.1 g/dL (ref 13.0–17.0)
IMMATURE GRANULOCYTES: 0 %
LYMPHS ABS: 3.2 10*3/uL (ref 0.7–4.0)
Lymphocytes Relative: 41 %
MCH: 28.5 pg (ref 26.0–34.0)
MCHC: 33.3 g/dL (ref 30.0–36.0)
MCV: 85.7 fL (ref 78.0–100.0)
Monocytes Absolute: 0.6 10*3/uL (ref 0.1–1.0)
Monocytes Relative: 7 %
NEUTROS ABS: 3.9 10*3/uL (ref 1.7–7.7)
NEUTROS PCT: 50 %
Platelets: 211 10*3/uL (ref 150–400)
RBC: 4.95 MIL/uL (ref 4.22–5.81)
RDW: 11.9 % (ref 11.5–15.5)
WBC: 7.9 10*3/uL (ref 4.0–10.5)

## 2018-05-27 LAB — COMPREHENSIVE METABOLIC PANEL
ALBUMIN: 3.4 g/dL — AB (ref 3.5–5.0)
ALT: 56 U/L — ABNORMAL HIGH (ref 0–44)
AST: 108 U/L — AB (ref 15–41)
Alkaline Phosphatase: 59 U/L (ref 38–126)
Anion gap: 10 (ref 5–15)
BUN: 10 mg/dL (ref 6–20)
CHLORIDE: 104 mmol/L (ref 98–111)
CO2: 25 mmol/L (ref 22–32)
Calcium: 9.5 mg/dL (ref 8.9–10.3)
Creatinine, Ser: 1.06 mg/dL (ref 0.61–1.24)
GFR calc Af Amer: >60 mL/min (ref >60)
GFR calc non Af Amer: >60 mL/min (ref >60)
Glucose, Bld: 99 mg/dL (ref 70–99)
POTASSIUM: 4 mmol/L (ref 3.5–5.1)
Sodium: 139 mmol/L (ref 135–145)
Total Bilirubin: 0.5 mg/dL (ref 0.3–1.2)
Total Protein: 6.5 g/dL (ref 6.5–8.1)

## 2018-05-27 LAB — GLUCOSE, CAPILLARY
GLUCOSE-CAPILLARY: 106 mg/dL — AB (ref 70–99)
GLUCOSE-CAPILLARY: 107 mg/dL — AB (ref 70–99)
Glucose-Capillary: 104 mg/dL — ABNORMAL HIGH (ref 70–99)
Glucose-Capillary: 183 mg/dL — ABNORMAL HIGH (ref 70–99)

## 2018-05-27 LAB — HCV RNA QUANT: HCV QUANT: NOT DETECTED [IU]/mL (ref >50)

## 2018-05-27 LAB — CK: Total CK: 2862 U/L — ABNORMAL HIGH (ref 49–397)

## 2018-05-27 LAB — MAGNESIUM: Magnesium: 1.8 mg/dL (ref 1.7–2.4)

## 2018-05-27 LAB — PHOSPHORUS: Phosphorus: 3.4 mg/dL (ref 2.5–4.6)

## 2018-05-27 MED ORDER — AMPHETAMINE-DEXTROAMPHETAMINE 10 MG PO TABS
10.0000 mg | ORAL_TABLET | Freq: Every day | ORAL | Status: DC
  Administered 2018-05-27 – 2018-05-28 (×2): 10 mg via ORAL
  Filled 2018-05-27 (×2): qty 1

## 2018-05-27 NOTE — Progress Notes (Signed)
PROGRESS NOTE    Isaac Snyder  XBJ:[id number] DOB: December 04, 1975 DOA: 05/22/2018 PCP: No primary care provider on file.   Brief Narrative:  42 year old male with a past medical history of attention deficit disorder, acute renal failure with prior rhabdomyolysis came to the hospital with complaints of generally not feeling well and dehydrated.  Patient admitted of feeling hot and warm last week and not hydrating himself enough since then he has been feeling worse.  He is also been taking Adderall at home.  He was noted to have increase in CK level-rhabdomyolysis therefore started on IV fluids.  There was suspicion for Adderall may be leading to his rhabdo therefore it was stopped.  With IV fluids his CK levels at first trended up and started trending down.   Assessment & Plan:   Principal Problem:   ARF (acute renal failure) (HCC) Active Problems:   Rhabdomyolysis  Mild rhabdomyolysis - Continue IV fluids.  Will increase the rate to 150 cc/h - CK levels have been trending down now, peaked at 5330.  Encourage oral diet and water intake.  Provide supportive care.  Pain control.  Will restart his Adderall today at lower dose-10 mg daily.  Acute kidney injury -This is improved with IV fluids.  History of tobacco use -Nicotine patch as needed  Leukocytosis, resolved -Likely secondary to dehydration.  Mild transaminitis -Previously been treated with Harvoni for hepatitis C. -Due to previous history of chronic infection along with likely rhabdo causing rise.  Could possibly also be alcohol-related pattern.  Currently they are trending down, will continue to monitor this.  DVT prophylaxis: Subcutaneous heparin Code Status: Full code  Family Communication: None at bedside Disposition Plan: Maintain inpatient stay for another 24 hours for continue IV fluid.  Consultants:   None   Procedures:   None   Antimicrobials:   None    Subjective: Feels better. Still little generalized  soreness. Tolerating PO.   Review of Systems Otherwise negative except as per HPI, including: General: Denies fever, chills, night sweats or unintended weight loss. Resp: Denies cough, wheezing, shortness of breath. Cardiac: Denies chest pain, palpitations, orthopnea, paroxysmal nocturnal dyspnea. GI: Denies abdominal pain, nausea, vomiting, diarrhea or constipation GU: Denies dysuria, frequency, hesitancy or incontinence MS: Denies muscle aches, joint pain or swelling Neuro: Denies headache, neurologic deficits (focal weakness, numbness, tingling), abnormal gait Psych: Denies anxiety, depression, SI/HI/AVH Skin: Denies new rashes or lesions ID: Denies sick contacts, exotic exposures, travel  Objective: Vitals:   05/26/18 1701 05/26/18 2100 05/27/18 0538 05/27/18 0949  BP: 108/72 136/81 109/68 (!) 137/93  Pulse: 66 63 67 64  Resp: 18 18 18  (!) 24  Temp: 98.2 F (36.8 C) 98.4 F (36.9 C) 98.5 F (36.9 C) 97.6 F (36.4 C)  TempSrc: Oral Oral  Oral  SpO2: 98% 98% 96% 97%  Weight:      Height:        Intake/Output Summary (Last 24 hours) at 05/27/2018 1116 Last data filed at 05/27/2018 0601 Gross per 24 hour  Intake 7091.25 ml  Output 3275 ml  Net 3816.25 ml   Filed Weights   05/22/18 2031  Weight: 81.6 kg (180 lb)    Examination:  General exam: Appears calm and comfortable  Respiratory system: Clear to auscultation. Respiratory effort normal. Cardiovascular system: S1 & S2 heard, RRR. No JVD, murmurs, rubs, gallops or clicks. No pedal edema. Gastrointestinal system: Abdomen is nondistended, soft and nontender. No organomegaly or masses felt. Normal bowel sounds heard. Central nervous system:  Alert and oriented. No focal neurological deficits. Extremities: Symmetric 5 x 5 power. Skin: No rashes, lesions or ulcers Psychiatry: Judgement and insight appear normal. Mood & affect appropriate.     Data Reviewed:   CBC: Recent Labs  Lab 05/22/18 2132 05/23/18 0232  05/24/18 0720 05/25/18 0629 05/26/18 0502 05/27/18 0357  WBC 15.3* 11.8* 5.3 6.3 7.2 7.9  NEUTROABS 10.5*  --  2.6 3.5 3.9 3.9  HGB 15.7 14.5 13.2 14.5 14.0 14.1  HCT 46.8 43.1 40.3 44.4 41.4 42.4  MCV 86.3 85.9 87.0 86.7 85.5 85.7  PLT 241 244 183 194 194 211   Basic Metabolic Panel: Recent Labs  Lab 05/23/18 0232 05/24/18 0720 05/25/18 0629 05/26/18 0502 05/27/18 0357  NA 136 142 142 138 139  K 3.6 4.1 4.0 3.8 4.0  CL 102 112* 107 106 104  CO2 25 26 29 26 25   GLUCOSE 181* 106* 118* 114* 99  BUN 22* 13 8 11 10   CREATININE 2.69* 1.44* 1.23 1.26* 1.06  CALCIUM 8.8* 8.4* 9.2 9.2 9.5  MG  --  1.8 1.7 1.8 1.8  PHOS  --  2.9 3.8 3.6 3.4   GFR: Estimated Creatinine Clearance: 98.6 mL/min (by C-G formula based on SCr of 1.06 mg/dL). Liver Function Tests: Recent Labs  Lab 05/23/18 0232 05/24/18 0720 05/25/18 0629 05/26/18 0502 05/27/18 0357  AST 54* 81* 114* 122* 108*  ALT 21 30 45* 52* 56*  ALKPHOS 65 51 56 58 59  BILITOT 0.9 0.4 0.5 0.6 0.5  PROT 6.9 5.7* 6.9 6.3* 6.5  ALBUMIN 3.7 2.9* 3.3* 3.3* 3.4*   No results for input(s): LIPASE, AMYLASE in the last 168 hours. No results for input(s): AMMONIA in the last 168 hours. Coagulation Profile: No results for input(s): INR, PROTIME in the last 168 hours. Cardiac Enzymes: Recent Labs  Lab 05/23/18 0232 05/24/18 0720 05/25/18 0836 05/26/18 0502 05/27/18 0357  CKTOTAL 3,872* 3,981* 5,330* 3,195* 2,862*  CKMB 15.2*  --   --   --   --    BNP (last 3 results) No results for input(s): PROBNP in the last 8760 hours. HbA1C: No results for input(s): HGBA1C in the last 72 hours. CBG: Recent Labs  Lab 05/26/18 0738 05/26/18 1124 05/26/18 1619 05/26/18 2100 05/27/18 0742  GLUCAP 148* 137* 116* 138* 106*   Lipid Profile: No results for input(s): CHOL, HDL, LDLCALC, TRIG, CHOLHDL, LDLDIRECT in the last 72 hours. Thyroid Function Tests: No results for input(s): TSH, T4TOTAL, FREET4, T3FREE, THYROIDAB in the last  72 hours. Anemia Panel: No results for input(s): VITAMINB12, FOLATE, FERRITIN, TIBC, IRON, RETICCTPCT in the last 72 hours. Sepsis Labs: No results for input(s): PROCALCITON, LATICACIDVEN in the last 168 hours.  Recent Results (from the past 240 hour(s))  MRSA PCR Screening     Status: None   Collection Time: 05/23/18  3:31 AM  Result Value Ref Range Status   MRSA by PCR NEGATIVE NEGATIVE Final    Comment:        The GeneXpert MRSA Assay (FDA approved for NASAL specimens only), is one component of a comprehensive MRSA colonization surveillance program. It is not intended to diagnose MRSA infection nor to guide or monitor treatment for MRSA infections. Performed at [facility], [address] [place], [place] [address]          Radiology Studies: [place] Abdomen Limited Ruq  Result Date: 05/25/2018 CLINICAL DATA:  Elevated liver enzymes EXAM: ULTRASOUND ABDOMEN LIMITED RIGHT UPPER QUADRANT COMPARISON:  CT  abdomen and pelvis September 24, 2018 FINDINGS: Gallbladder: No gallstones or wall thickening visualized. There is no pericholecystic fluid. No sonographic Murphy sign noted by sonographer. Common bile duct: Diameter: 4 mm. No intrahepatic or extrahepatic biliary duct dilatation. Liver: No focal lesion identified. Liver echogenicity overall is increased. Portal vein is patent on color Doppler imaging with normal direction of blood flow towards the liver. IMPRESSION: Increase in liver echogenicity, a finding felt to be indicative of a degree of hepatic steatosis. While no focal liver lesions are evident on this study, it must be cautioned that the sensitivity of ultrasound for detection of focal liver lesions is diminished in this circumstance. Study otherwise unremarkable. Electronically Signed   By: Bretta Bang III M.D.   On: 05/25/2018 12:25        Scheduled Meds: . amLODipine  10 mg Oral Daily  . amphetamine-dextroamphetamine  10 mg Oral Q breakfast  . cloNIDine   0.2 mg Oral BID  . heparin  5,000 Units Subcutaneous Q8H  . insulin aspart  0-9 Units Subcutaneous TID WC  . nicotine  21 mg Transdermal Daily   Continuous Infusions: . sodium chloride 125 mL/hr at 05/27/18 0601     LOS: 4 days    I have spent 25 minutes face to face with the patient and on the ward discussing the patients care, assessment, plan and disposition with other care givers. >50% of the time was devoted counseling the patient about the risks and benefits of treatment and coordinating care.     Mabrey Howland Joline Maxcy, MD [facility] Pager [phone]   If 7PM-7AM, please contact night-coverage [email or web address] Password [email or web address] 05/27/2018, 11:16 AM

## 2018-05-28 DIAGNOSIS — M6282 Rhabdomyolysis: Secondary | ICD-10-CM

## 2018-05-28 DIAGNOSIS — N179 Acute kidney failure, unspecified: Principal | ICD-10-CM

## 2018-05-28 LAB — COMPREHENSIVE METABOLIC PANEL
ALBUMIN: 3.5 g/dL (ref 3.5–5.0)
ALK PHOS: 60 U/L (ref 38–126)
ALT: 60 U/L — AB (ref 0–44)
AST: 82 U/L — ABNORMAL HIGH (ref 15–41)
Anion gap: 7 (ref 5–15)
BILIRUBIN TOTAL: 0.7 mg/dL (ref 0.3–1.2)
BUN: 10 mg/dL (ref 6–20)
CALCIUM: 9.5 mg/dL (ref 8.9–10.3)
CO2: 26 mmol/L (ref 22–32)
CREATININE: 1.17 mg/dL (ref 0.61–1.24)
Chloride: 106 mmol/L (ref 98–111)
GFR calc non Af Amer: >60 mL/min (ref >60)
GLUCOSE: 118 mg/dL — AB (ref 70–99)
Potassium: 3.9 mmol/L (ref 3.5–5.1)
SODIUM: 139 mmol/L (ref 135–145)
TOTAL PROTEIN: 6.5 g/dL (ref 6.5–8.1)

## 2018-05-28 LAB — CK: Total CK: 1039 U/L — ABNORMAL HIGH (ref 49–397)

## 2018-05-28 LAB — GLUCOSE, CAPILLARY: Glucose-Capillary: 102 mg/dL — ABNORMAL HIGH (ref 70–99)

## 2018-05-28 LAB — MAGNESIUM: Magnesium: 1.8 mg/dL (ref 1.7–2.4)

## 2018-05-28 MED ORDER — OXYCODONE HCL 5 MG PO TABS: 5.0000 mg | ORAL_TABLET | Freq: Four times a day (QID) | ORAL | 0 refills | Status: AC | PRN

## 2018-05-28 MED ORDER — AMPHETAMINE-DEXTROAMPHETAMINE 10 MG PO TABS: 10.0000 mg | ORAL_TABLET | Freq: Every day | ORAL | 0 refills | Status: AC

## 2018-05-28 NOTE — Discharge Summary (Signed)
Physician Discharge Summary  Wyvonne LenzJason Fier ZOX:[id number] DOB: [birth year] DOA: 05/22/2018  PCP: No primary care provider on file.  Admit date: 05/22/2018 Discharge date: 05/28/2018  Admitted From: Home Disposition: Home  Recommendations for Outpatient Follow-up:  1. Follow up with PCP in 1-2 weeks 2. Please obtain BMP/CK in one week your next doctors visit.  3. Advised to continue to hydrate himself at home 4. Adderall started at lower dose of 10 mg daily.  Home Health: None Equipment/Devices: None Discharge Condition: Stable CODE STATUS: Full code Diet recommendation: Regular  Brief/Interim Summary: 42 year old male with a past medical history of attention deficit disorder, acute renal failure with prior rhabdomyolysis came to the hospital with complaints of generally not feeling well and dehydrated.  Patient admitted of feeling hot and warm last week and not hydrating himself enough since then he has been feeling worse.  He is also been taking Adderall at home.  He was noted to have increase in CK level-rhabdomyolysis therefore started on IV fluids.  There was suspicion for Adderall may be leading to his rhabdo therefore it was stopped.  With IV fluids his CK levels at first trended up and started trending down.  On the day of discharge his CK level had trended down at 1000 and he was feeling back to himself.  He was tolerating oral diet without any issues.  Adderall was started at 10 mg daily, this can be uptitrated as needed outpatient. At this point patient has reached maximum benefit from an hospital stay and stable to be discharged.  I have advised him to follow-up outpatient with primary care physician in about 1 week and have BMP/CK levels checked.  Discharge Diagnoses:  Principal Problem:   ARF (acute renal failure) (HCC) Active Problems:   Rhabdomyolysis   Mild rhabdomyolysis secondary to dehydration - After aggressive IV fluid patient's CK level has trended down close to 1000,  it peaked at 5330.  He is tolerating oral diet without any issues.  At this time he does not have any complaints and feels back to himself.  I will restart his Adderall at lower dose of 10 mg daily, this can be uptitrated outpatient slowly back to his routine dose of 20 mg twice daily. -I have advised him to follow-up with primary care physician in about 1 week and have repeat blood work-BMP/CK levels checked again.  Acute kidney injury; resolved -This is improved with IV fluids.  History of tobacco use -Nicotine patch as needed  Leukocytosis, resolved -Likely secondary to dehydration.  Mild transaminitis -improved.   DVT prophylaxis: Subcutaneous heparin while here.  Code Status: Full code  Family Communication: None at bedside Disposition Plan: Discharge today    Discharge Instructions   Allergies as of 05/28/2018   No Known Allergies     Medication List    STOP taking these medications   amphetamine-dextroamphetamine 20 MG tablet Commonly known as:  ADDERALL Replaced by:  amphetamine-dextroamphetamine 10 MG tablet     TAKE these medications   amLODipine 10 MG tablet Commonly known as:  NORVASC Take 10 mg by mouth daily.   amphetamine-dextroamphetamine 10 MG tablet Commonly known as:  ADDERALL Take 1 tablet (10 mg total) by mouth daily with breakfast. Replaces:  amphetamine-dextroamphetamine 20 MG tablet   cloNIDine 0.2 MG tablet Commonly known as:  CATAPRES Take 0.2 mg by mouth 2 (two) times daily.   gabapentin 600 MG tablet Commonly known as:  NEURONTIN Take 600 mg by mouth 3 (three) times daily.  oxyCODONE 5 MG immediate release tablet Commonly known as:  Oxy IR/ROXICODONE Take 1 tablet (5 mg total) by mouth every 6 (six) hours as needed for severe pain. What changed:  when to take this      Follow-up Information    [facility]. Schedule an appointment as soon as possible for a visit in 1 week(s).   Contact  information: [address] [place] [address] [phone]         No Known Allergies  You were cared for by a hospitalist during your hospital stay. If you have any questions about your discharge medications or the care you received while you were in the hospital after you are discharged, you can call the unit and asked to speak with the hospitalist on call if the hospitalist that took care of you is not available. Once you are discharged, your primary care physician will handle any further medical issues. Please note that no refills for any discharge medications will be authorized once you are discharged, as it is imperative that you return to your primary care physician (or establish a relationship with a primary care physician if you do not have one) for your aftercare needs so that they can reassess your need for medications and monitor your lab values.  Consultations:  None    Procedures/Studies: US Renal  Result Date: 05/23/2018 CLINICAL DATA:  Acute kidney injury. Dysuria. Lower abdominal and back pain. EXAM: RENAL / URINARY TRACT ULTRASOUND COMPLETE COMPARISON:  None. FINDINGS: Right Kidney: Length: 10.8 cm. Cortical thickness and echogenicity is within normal limits. No mass or hydronephrosis visualized. Left Kidney: Length: 11.5 cm. Cortical thickness and echogenicity is within normal limits. No discrete mass, however, there is a focal lobular prominence within the outer cortex of the LEFT kidney, of uncertain significance, isoechoic mass not excluded. No hydronephrosis. Bladder: Appears normal for degree of bladder distention. IMPRESSION: 1. Focal lobulated contour within the outer cortex of the LEFT kidney, of uncertain significance, isoechoic renal mass not excluded. Recommend further characterization with renal protocol CT or renal MRI if renal function allows. 2. No acute findings.  No hydronephrosis. Electronically Signed   By: Bary Richard M.D.   On:  05/23/2018 13:41   Ct Renal Stone Study  Result Date: 05/24/2018 CLINICAL DATA:  Acute renal failure, lobular prominence of LEFT kidney on ultrasound EXAM: CT ABDOMEN AND PELVIS WITHOUT CONTRAST TECHNIQUE: Multidetector CT imaging of the abdomen and pelvis was performed following the standard protocol without IV contrast. Sagittal and coronal MPR images reconstructed from axial data set. COMPARISON:  Ultrasound kidneys 05/23/2018 FINDINGS: Lower chest: Minimal atelectasis dependently LEFT lung base. Remaining lung bases clear. Hepatobiliary: Contracted gallbladder.  Liver unremarkable. Pancreas: Atrophic pancreas Spleen: Normal appearance Adrenals/Urinary Tract: Adrenal glands normal appearance. Limited assessment of the kidneys due to lack of IV contrast. No definite renal mass lesion identified. Small cortical scar/cortical thinning inferior pole LEFT kidney adjacent mild lobular prominence of mid LEFT kidney, ureters and bladder otherwise unremarkable. Renal cortex. Stomach/Bowel: Normal appendix. Incomplete distention of the sigmoid colon and rectum limiting assessment. Stomach and bowel loops otherwise unremarkable. Vascular/Lymphatic: Atherosclerotic calcifications aorta. Aorta normal caliber. No adenopathy. Reproductive: Unremarkable Other: Small BILATERAL inguinal hernias containing fat larger on RIGHT. No free air, free fluid or inflammatory process. Musculoskeletal: Normal appearance IMPRESSION: Scarring/cortical thinning at inferior LEFT kidney with adjacent mild lobular prominence of the cortex of the mid LEFT kidney. No definite renal mass identified. Small BILATERAL inguinal hernias  containing fat. Electronically Signed   By: Ulyses Southward M.D.   On: 05/24/2018 18:57   US Abdomen Limited Ruq  Result Date: 05/25/2018 CLINICAL DATA:  Elevated liver enzymes EXAM: ULTRASOUND ABDOMEN LIMITED RIGHT UPPER QUADRANT COMPARISON:  CT abdomen and pelvis September 24, 2018 FINDINGS: Gallbladder: No gallstones or  wall thickening visualized. There is no pericholecystic fluid. No sonographic Murphy sign noted by sonographer. Common bile duct: Diameter: 4 mm. No intrahepatic or extrahepatic biliary duct dilatation. Liver: No focal lesion identified. Liver echogenicity overall is increased. Portal vein is patent on color Doppler imaging with normal direction of blood flow towards the liver. IMPRESSION: Increase in liver echogenicity, a finding felt to be indicative of a degree of hepatic steatosis. While no focal liver lesions are evident on this study, it must be cautioned that the sensitivity of ultrasound for detection of focal liver lesions is diminished in this circumstance. Study otherwise unremarkable. Electronically Signed   By: Bretta Bang III M.D.   On: 05/25/2018 12:25      Subjective: No complaints, feels great.   General = no fevers, chills, dizziness, malaise, fatigue HEENT/EYES = negative for pain, redness, loss of vision, double vision, blurred vision, loss of hearing, sore throat, hoarseness, dysphagia Cardiovascular= negative for chest pain, palpitation, murmurs, lower extremity swelling Respiratory/lungs= negative for shortness of breath, cough, hemoptysis, wheezing, mucus production Gastrointestinal= negative for nausea, vomiting,, abdominal pain, melena, hematemesis Genitourinary= negative for Dysuria, Hematuria, Change in Urinary Frequency MSK = Negative for arthralgia, myalgias, Back Pain, Joint swelling  Neurology= Negative for headache, seizures, numbness, tingling  Psychiatry= Negative for anxiety, depression, suicidal and homocidal ideation Allergy/Immunology= Medication/Food allergy as listed  Skin= Negative for Rash, lesions, ulcers, itching   Discharge Exam: Vitals:   05/27/18 2047 05/28/18 0435  BP: 111/76 110/77  Pulse: 67 77  Resp: 16 16  Temp: 98.2 F (36.8 C) 98 F (36.7 C)  SpO2: 100% 99%   Vitals:   05/27/18 0949 05/27/18 1715 05/27/18 2047 05/28/18 0435   BP: (!) 137/93 (!) 143/111 111/76 110/77  Pulse: 64 81 67 77  Resp: (!) 24 18 16 16   Temp: 97.6 F (36.4 C) 98.5 F (36.9 C) 98.2 F (36.8 C) 98 F (36.7 C)  TempSrc: Oral Oral Oral   SpO2: 97% 100% 100% 99%  Weight:      Height:        General: Pt is alert, awake, not in acute distress Cardiovascular: RRR, S1/S2 +, no rubs, no gallops Respiratory: CTA bilaterally, no wheezing, no rhonchi Abdominal: Soft, NT, ND, bowel sounds + Extremities: no edema, no cyanosis    The results of significant diagnostics from this hospitalization (including imaging, microbiology, ancillary and laboratory) are listed below for reference.     Microbiology: Recent Results (from the past 240 hour(s))  MRSA PCR Screening     Status: None   Collection Time: 05/23/18  3:31 AM  Result Value Ref Range Status   MRSA by PCR NEGATIVE NEGATIVE Final    Comment:        The GeneXpert MRSA Assay (FDA approved for NASAL specimens only), is one component of a comprehensive MRSA colonization surveillance program. It is not intended to diagnose MRSA infection nor to guide or monitor treatment for MRSA infections. Performed at [facility], [address], [place], [place] [address]      Labs: BNP (last 3 results) No results for input(s): BNP in the last 8760 hours. Basic Metabolic Panel: Recent Labs  Lab 05/24/18 0720 05/25/18 0629 05/26/18 0502 05/27/18 0357 05/28/18 0515  NA 142 142 138 139 139  K 4.1 4.0 3.8 4.0 3.9  CL 112* 107 106 104 106  CO2 26 29 26 25 26   GLUCOSE 106* 118* 114* 99 118*  BUN 13 8 11 10 10   CREATININE 1.44* 1.23 1.26* 1.06 1.17  CALCIUM 8.4* 9.2 9.2 9.5 9.5  MG 1.8 1.7 1.8 1.8 1.8  PHOS 2.9 3.8 3.6 3.4  --    Liver Function Tests: Recent Labs  Lab 05/24/18 0720 05/25/18 0629 05/26/18 0502 05/27/18 0357 05/28/18 0515  AST 81* 114* 122* 108* 82*  ALT 30 45* 52* 56* 60*  ALKPHOS 51 56 58 59 60  BILITOT 0.4 0.5 0.6 0.5 0.7  PROT 5.7* 6.9 6.3* 6.5  6.5  ALBUMIN 2.9* 3.3* 3.3* 3.4* 3.5   No results for input(s): LIPASE, AMYLASE in the last 168 hours. No results for input(s): AMMONIA in the last 168 hours. CBC: Recent Labs  Lab 05/22/18 2132 05/23/18 0232 05/24/18 0720 05/25/18 0629 05/26/18 0502 05/27/18 0357  WBC 15.3* 11.8* 5.3 6.3 7.2 7.9  NEUTROABS 10.5*  --  2.6 3.5 3.9 3.9  HGB 15.7 14.5 13.2 14.5 14.0 14.1  HCT 46.8 43.1 40.3 44.4 41.4 42.4  MCV 86.3 85.9 87.0 86.7 85.5 85.7  PLT 241 244 183 194 194 211   Cardiac Enzymes: Recent Labs  Lab 05/23/18 0232 05/24/18 0720 05/25/18 0836 05/26/18 0502 05/27/18 0357 05/28/18 0515  CKTOTAL 3,872* 3,981* 5,330* 3,195* 2,862* 1,039*  CKMB 15.2*  --   --   --   --   --    BNP: Invalid input(s): POCBNP CBG: Recent Labs  Lab 05/27/18 0742 05/27/18 1157 05/27/18 1641 05/27/18 2046 05/28/18 0732  GLUCAP 106* 104* 183* 107* 102*   D-Dimer No results for input(s): DDIMER in the last 72 hours. Hgb A1c No results for input(s): HGBA1C in the last 72 hours. Lipid Profile No results for input(s): CHOL, HDL, LDLCALC, TRIG, CHOLHDL, LDLDIRECT in the last 72 hours. Thyroid function studies No results for input(s): TSH, T4TOTAL, T3FREE, THYROIDAB in the last 72 hours.  Invalid input(s): FREET3 Anemia work up No results for input(s): VITAMINB12, FOLATE, FERRITIN, TIBC, IRON, RETICCTPCT in the last 72 hours. Urinalysis    Component Value Date/Time   COLORURINE YELLOW 05/23/2018 0250   APPEARANCEUR HAZY (A) 05/23/2018 0250   LABSPEC 1.011 05/23/2018 0250   PHURINE 5.0 05/23/2018 0250   GLUCOSEU NEGATIVE 05/23/2018 0250   HGBUR MODERATE (A) 05/23/2018 0250   BILIRUBINUR NEGATIVE 05/23/2018 0250   KETONESUR 5 (A) 05/23/2018 0250   PROTEINUR NEGATIVE 05/23/2018 0250   NITRITE NEGATIVE 05/23/2018 0250   LEUKOCYTESUR NEGATIVE 05/23/2018 0250   Sepsis Labs Invalid input(s): PROCALCITONIN,  WBC,  LACTICIDVEN Microbiology Recent Results (from the past 240 hour(s))   MRSA PCR Screening     Status: None   Collection Time: 05/23/18  3:31 AM  Result Value Ref Range Status   MRSA by PCR NEGATIVE NEGATIVE Final    Comment:        The GeneXpert MRSA Assay (FDA approved for NASAL specimens only), is one component of a comprehensive MRSA colonization surveillance program. It is not intended to diagnose MRSA infection nor to guide or monitor treatment for MRSA infections. Performed at [facility], [address], [place], [place] [address]      Time coordinating discharge:  I have spent 35 minutes face to face with the patient and on  the ward discussing the patients care, assessment, plan and disposition with other care givers. >50% of the time was devoted counseling the patient about the risks and benefits of treatment/Discharge disposition and coordinating care.   SIGNED:   Dimple Nanas, MD  [facility] 05/28/2018, 9:35 AM Pager   If 7PM-7AM, please contact night-coverage [email or web address] Password [email or web address]

## 2019-10-01 IMAGING — CT CT RENAL STONE PROTOCOL
2 of 4 series · 15 of 46 positions shown, 17 images · non-contrast
Comparison: Ultrasound kidneys 05/23/2018

CLINICAL DATA: Acute renal failure, lobular prominence of LEFT
kidney on ultrasound

EXAM:
CT ABDOMEN AND PELVIS WITHOUT CONTRAST
TECHNIQUE: Multidetector CT imaging of the abdomen and pelvis was performed
following the standard protocol without IV contrast. Sagittal and
coronal MPR images reconstructed from axial data set.

[Series 3: renal stone 5.0 · axial · 0.72mm/px · z∈[+835,+1260]mm · 12 of 101 slices shown, 14 images]
[im 8/101  soft-tissue]
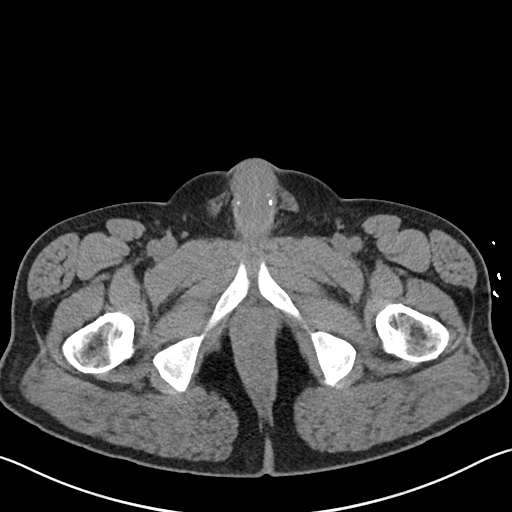
[im 8/101  bone]
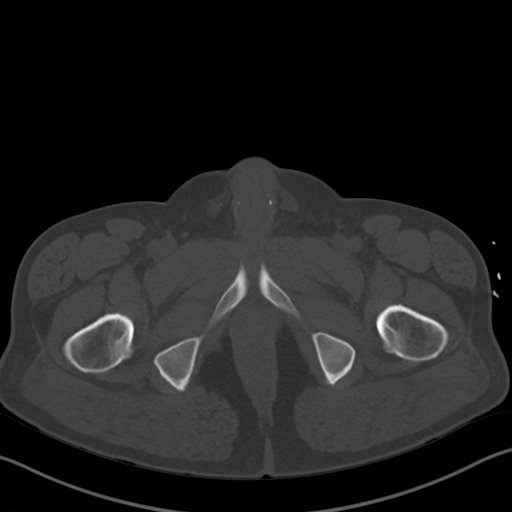
[im 16/101  soft-tissue]
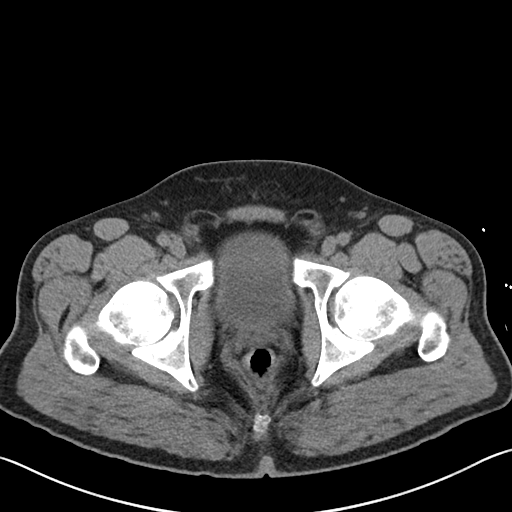
[im 24/101  soft-tissue]
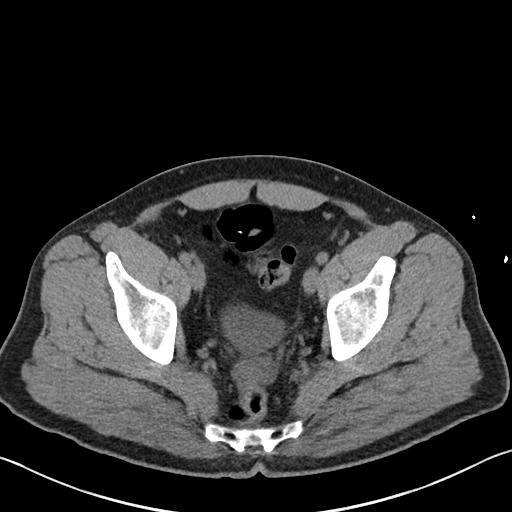
[im 31/101  soft-tissue]
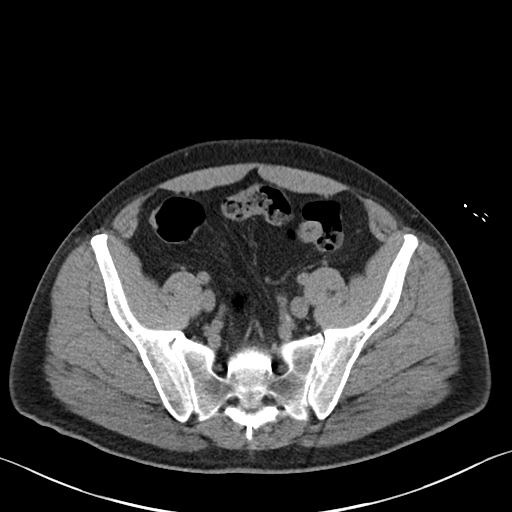
[im 39/101  soft-tissue]
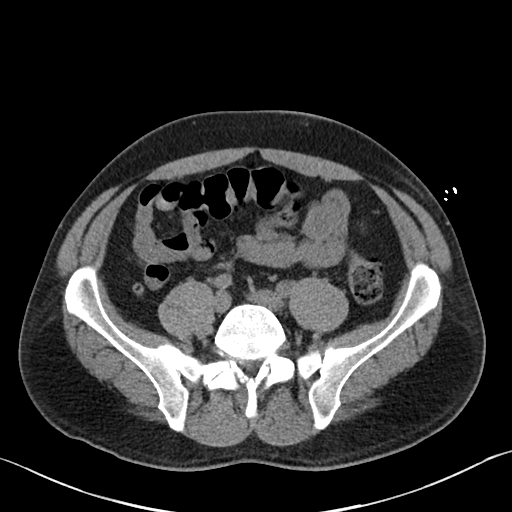
[im 47/101  soft-tissue]
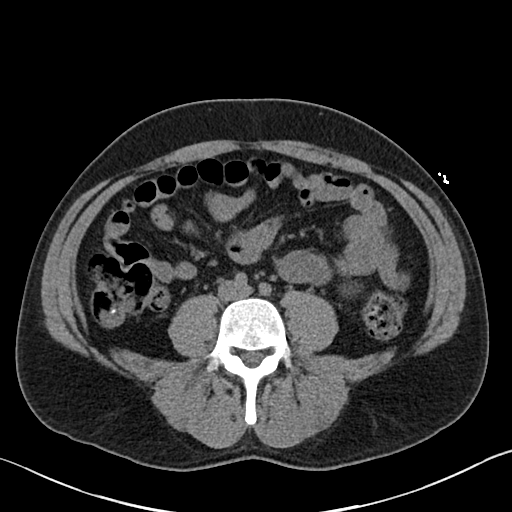
[im 54/101  soft-tissue]
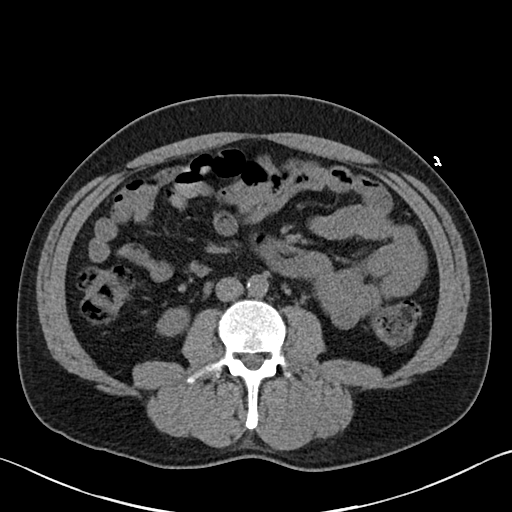
[im 62/101  soft-tissue]
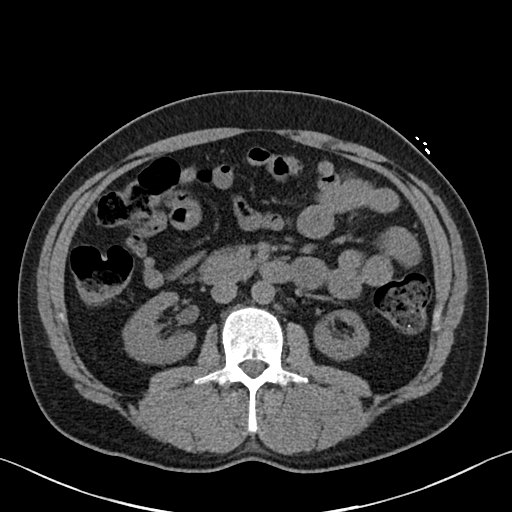
[im 70/101  soft-tissue]
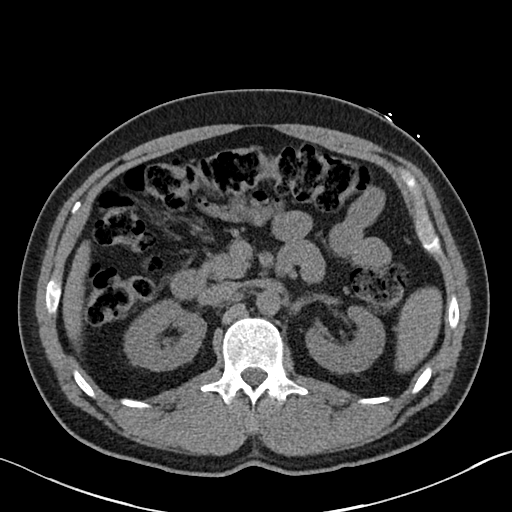
[im 70/101  bone]
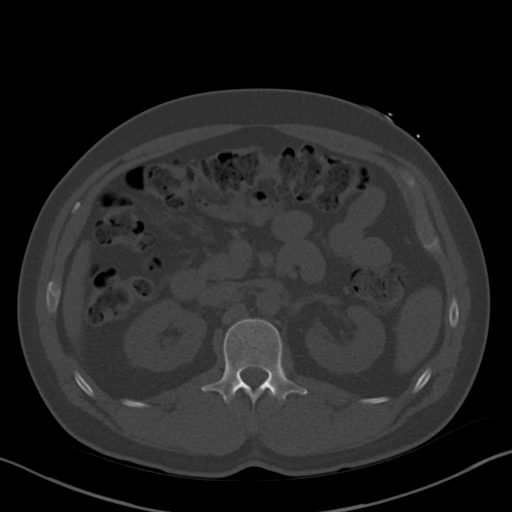
[im 77/101  soft-tissue]
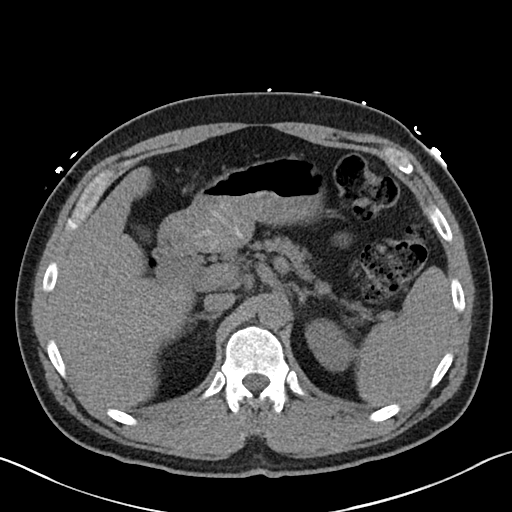
[im 85/101  soft-tissue]
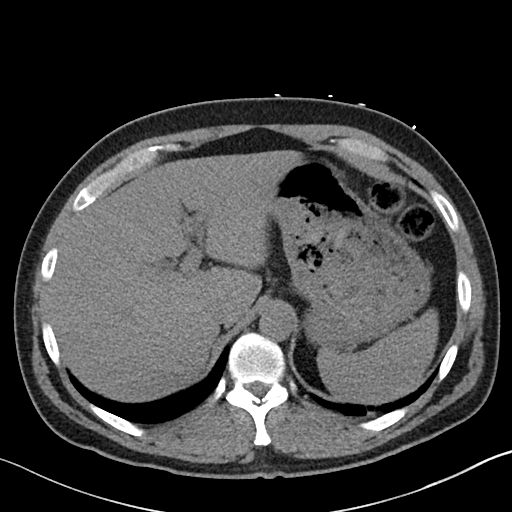
[im 93/101  soft-tissue]
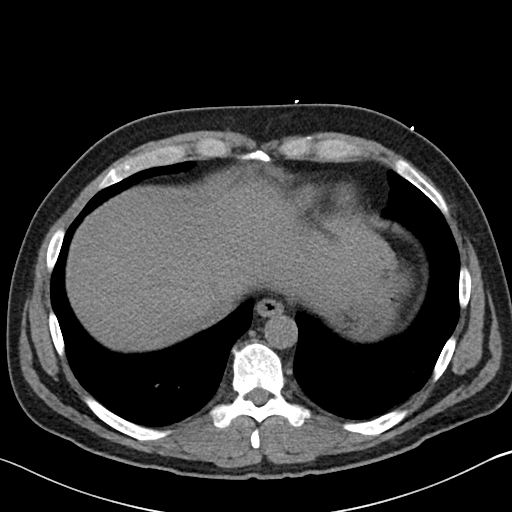

[Series 5: renal stone 3.0 cor · coronal · 0.63mm/px · 3 of 101 slices shown]
[im 34/101  soft-tissue]
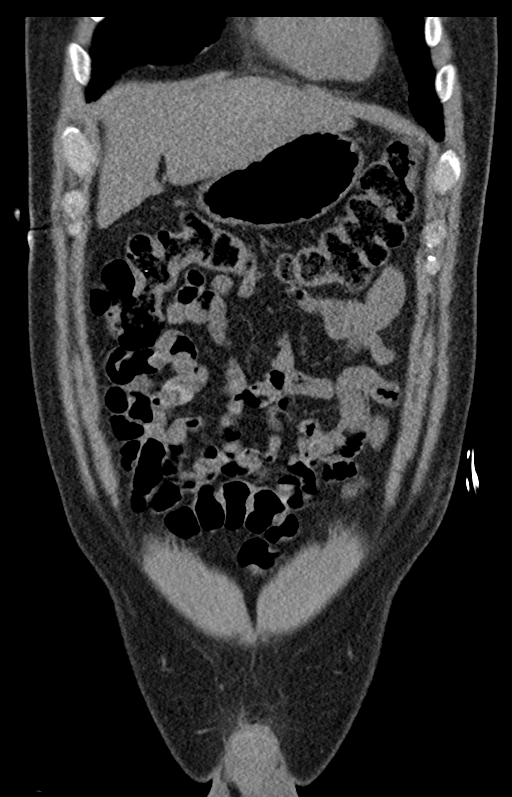
[im 45/101  soft-tissue]
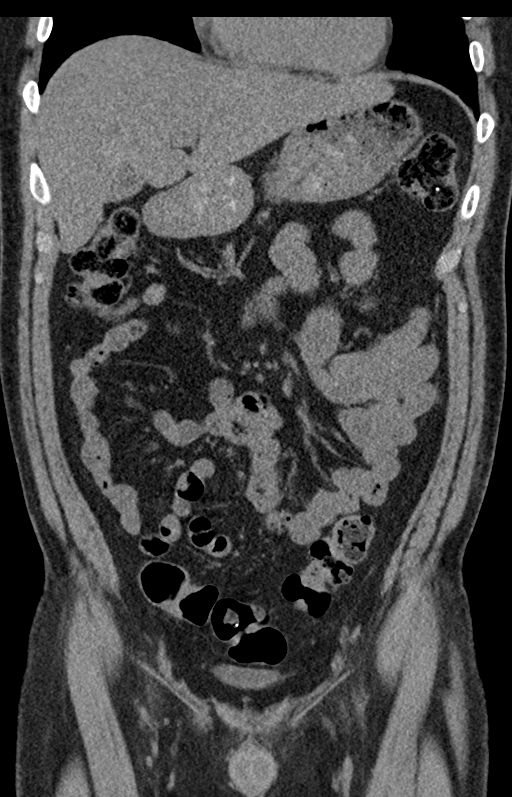
[im 56/101  soft-tissue]
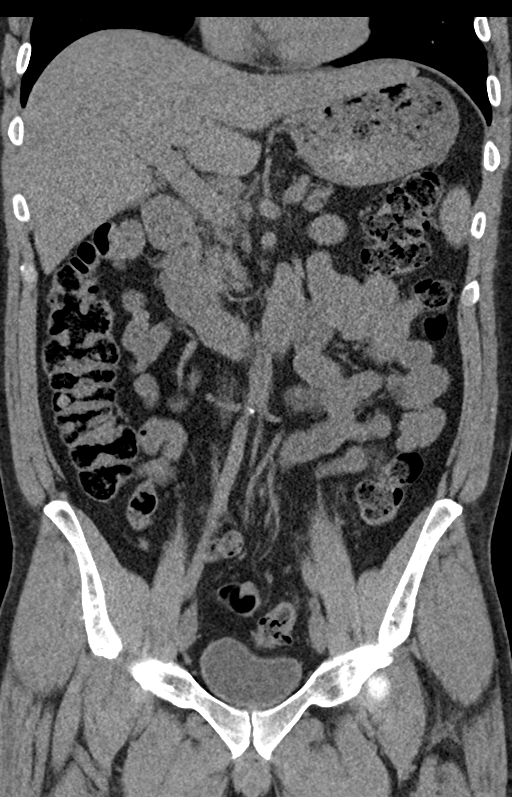

[15 of 46 positions shown; findings below may reference images not displayed]

FINDINGS: Lower chest: Minimal atelectasis dependently LEFT lung base.
Remaining lung bases clear.

Hepatobiliary: Contracted gallbladder.  Liver unremarkable.

Pancreas: Atrophic pancreas

Spleen: Normal appearance

Adrenals/Urinary Tract: Adrenal glands normal appearance. Limited
assessment of the kidneys due to lack of IV contrast. No definite
renal mass lesion identified. Small cortical scar/cortical thinning
inferior pole LEFT kidney adjacent mild lobular prominence of mid
LEFT kidney, ureters and bladder otherwise unremarkable. Renal
cortex.

Stomach/Bowel: Normal appendix. Incomplete distention of the sigmoid
colon and rectum limiting assessment. Stomach and bowel loops
otherwise unremarkable.

Vascular/Lymphatic: Atherosclerotic calcifications aorta. Aorta
normal caliber. No adenopathy.

Reproductive: Unremarkable

Other: Small BILATERAL inguinal hernias containing fat larger on
RIGHT. No free air, free fluid or inflammatory process.

Musculoskeletal: Normal appearance
IMPRESSION: Scarring/cortical thinning at inferior LEFT kidney with adjacent
mild lobular prominence of the cortex of the mid LEFT kidney.

No definite renal mass identified.

Small BILATERAL inguinal hernias containing fat.
